# Patient Record
Sex: Male | Born: 1970 | Race: Black or African American | Hispanic: No | Smoking: Never smoker
Health system: Southern US, Community
[De-identification: ages and names within clinical notes are randomized; demographics above are authoritative.]

---

## 1997-10-26 ENCOUNTER — Emergency Department (HOSPITAL_COMMUNITY): Admission: EM | Admit: 1997-10-26 | Discharge: 1997-10-26 | Payer: Self-pay | Admitting: Emergency Medicine

## 2014-08-19 ENCOUNTER — Encounter (HOSPITAL_COMMUNITY): Payer: Self-pay | Admitting: Emergency Medicine

## 2014-08-19 ENCOUNTER — Emergency Department (HOSPITAL_COMMUNITY): Payer: BLUE CROSS/BLUE SHIELD

## 2014-08-19 ENCOUNTER — Emergency Department (HOSPITAL_COMMUNITY)
Admission: EM | Admit: 2014-08-19 | Discharge: 2014-08-19 | Disposition: A | Discharge status: Home / Self Care | Payer: BLUE CROSS/BLUE SHIELD | Attending: Emergency Medicine | Admitting: Emergency Medicine

## 2014-08-19 DIAGNOSIS — J9801 Acute bronchospasm: Secondary | ICD-10-CM | POA: Diagnosis not present

## 2014-08-19 DIAGNOSIS — R0602 Shortness of breath: Secondary | ICD-10-CM | POA: Diagnosis present

## 2014-08-19 LAB — CBC
HCT: 46.9 % (ref 39.0–52.0)
Hemoglobin: 16.3 g/dL (ref 13.0–17.0)
MCH: 30.8 pg (ref 26.0–34.0)
MCHC: 34.8 g/dL (ref 30.0–36.0)
MCV: 88.7 fL (ref 78.0–100.0)
Platelets: 236 10*3/uL (ref 150–400)
RBC: 5.29 MIL/uL (ref 4.22–5.81)
RDW: 13.5 % (ref 11.5–15.5)
WBC: 10.6 10*3/uL — ABNORMAL HIGH (ref 4.0–10.5)

## 2014-08-19 LAB — COMPREHENSIVE METABOLIC PANEL
ALT: 26 U/L (ref 0–53)
AST: 23 U/L (ref 0–37)
Albumin: 4.4 g/dL (ref 3.5–5.2)
Alkaline Phosphatase: 71 U/L (ref 39–117)
Anion gap: 9 (ref 5–15)
BUN: 11 mg/dL (ref 6–23)
CO2: 23 mmol/L (ref 19–32)
Calcium: 9 mg/dL (ref 8.4–10.5)
Chloride: 108 mmol/L (ref 96–112)
Creatinine, Ser: 1.12 mg/dL (ref 0.50–1.35)
GFR calc Af Amer: >90 mL/min (ref >90)
GFR calc non Af Amer: 79 mL/min — ABNORMAL LOW (ref >90)
GLUCOSE: 124 mg/dL — AB (ref 70–99)
Potassium: 3.8 mmol/L (ref 3.5–5.1)
Sodium: 140 mmol/L (ref 135–145)
Total Bilirubin: 0.6 mg/dL (ref 0.3–1.2)
Total Protein: 8.2 g/dL (ref 6.0–8.3)

## 2014-08-19 MED ORDER — HYDROMORPHONE HCL 1 MG/ML IJ SOLN: 0.5000 mg | Freq: Once | INTRAMUSCULAR | Status: DC

## 2014-08-19 MED ORDER — IPRATROPIUM-ALBUTEROL 0.5-2.5 (3) MG/3ML IN SOLN
RESPIRATORY_TRACT | Status: AC
  Administered 2014-08-19: 3 mL via RESPIRATORY_TRACT
  Filled 2014-08-19: qty 3

## 2014-08-19 MED ORDER — IPRATROPIUM-ALBUTEROL 0.5-2.5 (3) MG/3ML IN SOLN
3.0000 mL | Freq: Once | RESPIRATORY_TRACT | Status: AC
  Administered 2014-08-19: 3 mL via RESPIRATORY_TRACT
  Filled 2014-08-19: qty 3

## 2014-08-19 MED ORDER — ALBUTEROL SULFATE HFA 108 (90 BASE) MCG/ACT IN AERS
1.0000 | INHALATION_SPRAY | RESPIRATORY_TRACT | Status: DC | PRN
  Administered 2014-08-19: 2 via RESPIRATORY_TRACT
  Filled 2014-08-19: qty 6.7

## 2014-08-19 MED ORDER — IPRATROPIUM-ALBUTEROL 0.5-2.5 (3) MG/3ML IN SOLN
3.0000 mL | Freq: Once | RESPIRATORY_TRACT | Status: AC
  Administered 2014-08-19: 3 mL via RESPIRATORY_TRACT

## 2014-08-19 MED ORDER — PREDNISONE 20 MG PO TABS: 60.0000 mg | ORAL_TABLET | Freq: Every day | ORAL | Status: DC

## 2014-08-19 MED ORDER — METHYLPREDNISOLONE SODIUM SUCC 125 MG IJ SOLR
125.0000 mg | Freq: Once | INTRAMUSCULAR | Status: AC
  Administered 2014-08-19: 125 mg via INTRAVENOUS
  Filled 2014-08-19: qty 2

## 2014-08-19 MED ORDER — ALBUTEROL SULFATE HFA 108 (90 BASE) MCG/ACT IN AERS: 1.0000 | INHALATION_SPRAY | RESPIRATORY_TRACT | Status: DC | PRN

## 2014-08-19 NOTE — ED Notes (Signed)
Pt ambulated well in the hall.  Pt denies significant change in breathing during ambulation; pt's O2 sat 91-92% on room air while ambulating.

## 2014-08-19 NOTE — Discharge Instructions (Signed)
Please take albuterol and prednisone as prescribed.  Is important for you to follow-up with a primary care doctor for further workup of your wheezing and persistent cough to determine if you have asthma.  Return to the [facility] department for worsening condition or new concerning symptoms.    Bronchospasm A bronchospasm is a spasm or tightening of the airways going into the lungs. During a bronchospasm breathing becomes more difficult because the airways get smaller. When this happens there can be coughing, a whistling sound when breathing (wheezing), and difficulty breathing. Bronchospasm is often associated with asthma, but not all patients who experience a bronchospasm have asthma. CAUSES  A bronchospasm is caused by inflammation or irritation of the airways. The inflammation or irritation may be triggered by:   Allergies (such as to animals, pollen, food, or mold). Allergens that cause bronchospasm may cause wheezing immediately after exposure or many hours later.   Infection. Viral infections are believed to be the most common cause of bronchospasm.   Exercise.   Irritants (such as pollution, cigarette smoke, strong odors, aerosol sprays, and paint fumes).   Weather changes. Winds increase molds and pollens in the air. Rain refreshes the air by washing irritants out. Cold air may cause inflammation.   Stress and emotional upset.  SIGNS AND SYMPTOMS   Wheezing.   Excessive nighttime coughing.   Frequent or severe coughing with a simple cold.   Chest tightness.   Shortness of breath.  DIAGNOSIS  Bronchospasm is usually diagnosed through a history and physical exam. Tests, such as chest X-rays, are sometimes done to look for other conditions. TREATMENT   Inhaled medicines can be given to open up your airways and help you breathe. The medicines can be given using either an inhaler or a nebulizer machine.  Corticosteroid medicines may be given for severe bronchospasm,  usually when it is associated with asthma. HOME CARE INSTRUCTIONS   Always have a plan prepared for seeking medical care. Know when to call your health care provider and local emergency services (911 in the [place] Know where you can access local emergency care.  Only take medicines as directed by your health care provider.  If you were prescribed an inhaler or nebulizer machine, ask your health care provider to explain how to use it correctly. Always use a spacer with your inhaler if you were given one.  It is necessary to remain calm during an attack. Try to relax and breathe more slowly.  Control your home environment in the following ways:   Change your heating and air conditioning filter at least once a month.   Limit your use of fireplaces and wood stoves.  Do not smoke and do not allow smoking in your home.   Avoid exposure to perfumes and fragrances.   Get rid of pests (such as roaches and mice) and their droppings.   Throw away plants if you see mold on them.   Keep your house clean and dust free.   Replace carpet with wood, tile, or vinyl flooring. Carpet can trap dander and dust.   Use allergy-proof pillows, mattress covers, and box spring covers.   Wash bed sheets and blankets every week in hot water and dry them in a dryer.   Use blankets that are made of polyester or cotton.   Wash hands frequently. SEEK MEDICAL CARE IF:   You have muscle aches.   You have chest pain.   The sputum changes from clear or white to yellow, green, gray,  or bloody.   The sputum you cough up gets thicker.   There are problems that may be related to the medicine you are given, such as a rash, itching, swelling, or trouble breathing.  SEEK IMMEDIATE MEDICAL CARE IF:   You have worsening wheezing and coughing even after taking your prescribed medicines.   You have increased difficulty breathing.   You develop severe chest pain. MAKE SURE YOU:   Understand  these instructions.  Will watch your condition.  Will get help right away if you are not doing well or get worse. Document Released: 04/17/2003 Document Revised: 04/19/2013 Document Reviewed: 10/04/2012 [facility] Patient Information 2015 [place], [place]. This information is not intended to replace advice given to you by your health care provider. Make sure you discuss any questions you have with your health care provider.  How to Use an Inhaler Proper inhaler technique is very important. Good technique ensures that the medicine reaches the lungs. Poor technique results in depositing the medicine on the tongue and back of the throat rather than in the airways. If you do not use the inhaler with good technique, the medicine will not help you. STEPS TO FOLLOW IF USING AN INHALER WITHOUT AN EXTENSION TUBE  Remove the cap from the inhaler.  If you are using the inhaler for the first time, you will need to prime it. Shake the inhaler for 5 seconds and release four puffs into the air, away from your face. Ask your health care provider or pharmacist if you have questions about priming your inhaler.  Shake the inhaler for 5 seconds before each breath in (inhalation).  Position the inhaler so that the top of the canister faces up.  Put your index finger on the top of the medicine canister. Your thumb supports the bottom of the inhaler.  Open your mouth.  Either place the inhaler between your teeth and place your lips tightly around the mouthpiece, or hold the inhaler 1-2 inches away from your open mouth. If you are unsure of which technique to use, ask your health care provider.  Breathe out (exhale) normally and as completely as possible.  Press the canister down with your index finger to release the medicine.  At the same time as the canister is pressed, inhale deeply and slowly until your lungs are completely filled. This should take 4-6 seconds. Keep your tongue down.  Hold the medicine in  your lungs for 5-10 seconds (10 seconds is best). This helps the medicine get into the small airways of your lungs.  Breathe out slowly, through pursed lips. Whistling is an example of pursed lips.  Wait at least 15-30 seconds between puffs. Continue with the above steps until you have taken the number of puffs your health care provider has ordered. Do not use the inhaler more than your health care provider tells you.  Replace the cap on the inhaler.  Follow the directions from your health care provider or the inhaler insert for cleaning the inhaler. STEPS TO FOLLOW IF USING AN INHALER WITH AN EXTENSION (SPACER)  Remove the cap from the inhaler.  If you are using the inhaler for the first time, you will need to prime it. Shake the inhaler for 5 seconds and release four puffs into the air, away from your face. Ask your health care provider or pharmacist if you have questions about priming your inhaler.  Shake the inhaler for 5 seconds before each breath in (inhalation).  Place the open end of the spacer  onto the mouthpiece of the inhaler.  Position the inhaler so that the top of the canister faces up and the spacer mouthpiece faces you.  Put your index finger on the top of the medicine canister. Your thumb supports the bottom of the inhaler and the spacer.  Breathe out (exhale) normally and as completely as possible.  Immediately after exhaling, place the spacer between your teeth and into your mouth. Close your lips tightly around the spacer.  Press the canister down with your index finger to release the medicine.  At the same time as the canister is pressed, inhale deeply and slowly until your lungs are completely filled. This should take 4-6 seconds. Keep your tongue down and out of the way.  Hold the medicine in your lungs for 5-10 seconds (10 seconds is best). This helps the medicine get into the small airways of your lungs. Exhale.  Repeat inhaling deeply through the spacer  mouthpiece. Again hold that breath for up to 10 seconds (10 seconds is best). Exhale slowly. If it is difficult to take this second deep breath through the spacer, breathe normally several times through the spacer. Remove the spacer from your mouth.  Wait at least 15-30 seconds between puffs. Continue with the above steps until you have taken the number of puffs your health care provider has ordered. Do not use the inhaler more than your health care provider tells you.  Remove the spacer from the inhaler, and place the cap on the inhaler.  Follow the directions from your health care provider or the inhaler insert for cleaning the inhaler and spacer. If you are using different kinds of inhalers, use your quick relief medicine to open the airways 10-15 minutes before using a steroid if instructed to do so by your health care provider. If you are unsure which inhalers to use and the order of using them, ask your health care provider, nurse, or respiratory therapist. If you are using a steroid inhaler, always rinse your mouth with water after your last puff, then gargle and spit out the water. Do not swallow the water. AVOID:  Inhaling before or after starting the spray of medicine. It takes practice to coordinate your breathing with triggering the spray.  Inhaling through the nose (rather than the mouth) when triggering the spray. HOW TO DETERMINE IF YOUR INHALER IS FULL OR NEARLY EMPTY You cannot know when an inhaler is empty by shaking it. A few inhalers are now being made with dose counters. Ask your health care provider for a prescription that has a dose counter if you feel you need that extra help. If your inhaler does not have a counter, ask your health care provider to help you determine the date you need to refill your inhaler. Write the refill date on a calendar or your inhaler canister. Refill your inhaler 7-10 days before it runs out. Be sure to keep an adequate supply of medicine. This  includes making sure it is not expired, and that you have a spare inhaler.  SEEK MEDICAL CARE IF:   Your symptoms are only partially relieved with your inhaler.  You are having trouble using your inhaler.  You have some increase in phlegm. SEEK IMMEDIATE MEDICAL CARE IF:   You feel little or no relief with your inhalers. You are still wheezing and are feeling shortness of breath or tightness in your chest or both.  You have dizziness, headaches, or a fast heart rate.  You have chills, fever, or night  sweats.  You have a noticeable increase in phlegm production, or there is blood in the phlegm. MAKE SURE YOU:   Understand these instructions.  Will watch your condition.  Will get help right away if you are not doing well or get worse. Document Released: 04/11/2000 Document Revised: 02/02/2013 Document Reviewed: 11/11/2012 [facility] Patient Information 2015 [place], [place]. This information is not intended to replace advice given to you by your health care provider. Make sure you discuss any questions you have with your health care provider.  Asthma Asthma is a condition of the lungs in which the airways tighten and narrow. Asthma can make it hard to breathe. Asthma cannot be cured, but medicine and lifestyle changes can help control it. Asthma may be started (triggered) by:  Animal skin flakes (dander).  Dust.  Cockroaches.  Pollen.  Mold.  Smoke.  Cleaning products.  Hair sprays or aerosol sprays.  Paint fumes or strong smells.  Cold air, weather changes, and winds.  Crying or laughing hard.  Stress.  Certain medicines or drugs.  Foods, such as dried fruit, potato chips, and sparkling grape juice.  Infections or conditions (colds, flu).  Exercise.  Certain medical conditions or diseases.  Exercise or tiring activities. HOME CARE   Take medicine as told by your doctor.  Use a peak flow meter as told by your doctor. A peak flow meter is a tool that  measures how well the lungs are working.  Record and keep track of the peak flow meter's readings.  Understand and use the asthma action plan. An asthma action plan is a written plan for taking care of your asthma and treating your attacks.  To help prevent asthma attacks:  Do not smoke. Stay away from secondhand smoke.  Change your heating and air conditioning filter often.  Limit your use of fireplaces and wood stoves.  Get rid of pests (such as roaches and mice) and their droppings.  Throw away plants if you see mold on them.  Clean your floors. Dust regularly. Use cleaning products that do not smell.  Have someone vacuum when you are not home. Use a vacuum cleaner with a HEPA filter if possible.  Replace carpet with wood, tile, or vinyl flooring. Carpet can trap animal skin flakes and dust.  Use allergy-proof pillows, mattress covers, and box spring covers.  Wash bed sheets and blankets every week in hot water and dry them in a dryer.  Use blankets that are made of polyester or cotton.  Clean bathrooms and kitchens with bleach. If possible, have someone repaint the walls in these rooms with mold-resistant paint. Keep out of the rooms that are being cleaned and painted.  Wash hands often. GET HELP IF:  You have make a whistling sound when breaking (wheeze), have shortness of breath, or have a cough even if taking medicine to prevent attacks.  The colored mucus you cough up (sputum) is thicker than usual.  The colored mucus you cough up changes from clear or white to yellow, green, gray, or bloody.  You have problems from the medicine you are taking such as:  A rash.  Itching.  Swelling.  Trouble breathing.  You need reliever medicines more than 2-3 times a week.  Your peak flow measurement is still at 50-79% of your personal best after following the action plan for 1 hour.  You have a fever. GET HELP RIGHT AWAY IF:   You seem to be worse and are not  responding to medicine during  an asthma attack.  You are short of breath even at rest.  You get short of breath when doing very little activity.  You have trouble eating, drinking, or talking.  You have chest pain.  You have a fast heartbeat.  Your lips or fingernails start to turn blue.  You are light-headed, dizzy, or faint.  Your peak flow is less than 50% of your personal best. MAKE SURE YOU:   Understand these instructions.  Will watch your condition.  Will get help right away if you are not doing well or get worse. Document Released: 10/01/2007 Document Revised: 08/29/2013 Document Reviewed: 11/11/2012 [facility] Patient Information 2015 [place]. This information is not intended to replace advice given to you by your health care provider. Make sure you discuss any questions you have with your health care provider.

## 2014-08-19 NOTE — ED Notes (Signed)
Pt arrived to the ED with a complaint of shortness of breath.  Pt states he has had congestion for today.  Pt states he has had shortness of breath sincde the same time.  Pt has expiratory wheezing.  Pt has labored breathing.

## 2014-08-19 NOTE — ED Provider Notes (Signed)
CSN: [id number]     Arrival date & time 08/19/14  0300 History   First MD Initiated Contact with Patient 08/19/14 0358     Chief Complaint  Patient presents with  . Shortness of Breath     (Consider location/radiation/quality/duration/timing/severity/associated sxs/prior Treatment) HPI 44 yo male presents to the ER from home with complaint of cough, congestion, wheezing, and shortness of breath.  No history of asthma, but reports he usually has similar sxs 1-2 times a year.  Asthma as child.  Initial sats of 90%, diffuse ins/exp wheezing.  Has received 1 duoneb prior to my evaluation and reports he is feeling better No past medical history on file. No past surgical history on file. No family history on file. History  Substance Use Topics  . Smoking status: Never Smoker   . Smokeless tobacco: Never Used  . Alcohol Use: Yes    Review of Systems  Respiratory: Positive for cough, chest tightness, shortness of breath and wheezing.   All other systems reviewed and are negative.     Allergies  Review of patient's allergies indicates no known allergies.  Home Medications   Prior to Admission medications   Medication Sig Start Date End Date Taking? Authorizing Provider  albuterol (PROVENTIL HFA;VENTOLIN HFA) 108 (90 BASE) MCG/ACT inhaler Inhale 1-2 puffs into the lungs every 4 (four) hours as needed for wheezing or shortness of breath. 08/19/14   Marisa Severinlga Sanaiya Welliver, MD  guaiFENesin (MUCINEX) 600 MG 12 hr tablet Take 600 mg by mouth once.   Yes Historical Provider, MD  guaiFENesin (ROBITUSSIN) 100 MG/5ML SOLN Take 5 mLs by mouth once.   Yes Historical Provider, MD  predniSONE (DELTASONE) 20 MG tablet Take 3 tablets (60 mg total) by mouth daily. 08/19/14   Marisa Severinlga Romy Ipock, MD   BP 135/71 mmHg  Pulse 84  Temp(Src) 99.4 F (37.4 C) (Oral)  Resp 20  SpO2 93% Physical Exam  Constitutional: He is oriented to person, place, and time. He appears well-developed and well-nourished.  HENT:  Head:  Normocephalic and atraumatic.  Nose: Nose normal.  Mouth/Throat: Oropharynx is clear and moist.  Eyes: Conjunctivae and EOM are normal. Pupils are equal, round, and reactive to light.  Neck: Normal range of motion. Neck supple. No JVD present. No tracheal deviation present. No thyromegaly present.  Cardiovascular: Normal rate, regular rhythm, normal heart sounds and intact distal pulses.  Exam reveals no gallop and no friction rub.   No murmur heard. Pulmonary/Chest: Effort normal. No stridor. No respiratory distress. He has wheezes. He has no rales. He exhibits no tenderness.  Abdominal: Soft. Bowel sounds are normal. He exhibits no distension and no mass. There is no tenderness. There is no rebound and no guarding.  Musculoskeletal: Normal range of motion. He exhibits no edema or tenderness.  Lymphadenopathy:    He has no cervical adenopathy.  Neurological: He is alert and oriented to person, place, and time. He displays normal reflexes. He exhibits normal muscle tone. Coordination normal.  Skin: Skin is warm and dry. No rash noted. No erythema. No pallor.  Psychiatric: He has a normal mood and affect. His behavior is normal. Judgment and thought content normal.  Nursing note and vitals reviewed.   ED Course  Procedures (including critical care time) Labs Review Labs Reviewed  CBC - Abnormal; Notable for the following:    WBC 10.6 (*)    All other components within normal limits  COMPREHENSIVE METABOLIC PANEL - Abnormal; Notable for the following:    Glucose, Bld  124 (*)    GFR calc non Af Amer 79 (*)    All other components within normal limits    Imaging Review Dg Chest 2 View  08/19/2014   CLINICAL DATA:  Acute onset of shortness of breath and congestion. Expiratory wheezing. Initial encounter.  EXAM: CHEST  2 VIEW  COMPARISON:  None.  FINDINGS: The lungs are well-aerated and clear. There is no evidence of focal opacification, pleural effusion or pneumothorax.  The heart is  normal in size; the mediastinal contour is within normal limits. No acute osseous abnormalities are seen.  IMPRESSION: No acute cardiopulmonary process seen.   Electronically Signed   By: Roanna Raider M.D.   On: 08/19/2014 04:09     EKG Interpretation None      MDM   Final diagnoses:  Bronchospasm    Pt with persistent wheezing after initial neb.  To receive solumedrol, additional neb.  Wheezing much improved.  Pt with low O2 sats with ambulation, but stays above 90%.  No respiratory distress.  Pt to be given rx for prednisone, albuterol inhaler and given f/u resources.    Marisa Severin, MD 08/19/14 1740

## 2015-01-25 ENCOUNTER — Encounter: Payer: Self-pay | Admitting: Internal Medicine

## 2015-01-25 ENCOUNTER — Ambulatory Visit (INDEPENDENT_AMBULATORY_CARE_PROVIDER_SITE_OTHER): Payer: BLUE CROSS/BLUE SHIELD | Admitting: Internal Medicine

## 2015-01-25 ENCOUNTER — Other Ambulatory Visit (INDEPENDENT_AMBULATORY_CARE_PROVIDER_SITE_OTHER): Payer: BLUE CROSS/BLUE SHIELD

## 2015-01-25 VITALS — BP 100/70 | HR 61 | Ht 70.0 in | Wt 241.0 lb

## 2015-01-25 DIAGNOSIS — Z0000 Encounter for general adult medical examination without abnormal findings: Secondary | ICD-10-CM

## 2015-01-25 DIAGNOSIS — Z0001 Encounter for general adult medical examination with abnormal findings: Secondary | ICD-10-CM

## 2015-01-25 DIAGNOSIS — R062 Wheezing: Secondary | ICD-10-CM | POA: Diagnosis not present

## 2015-01-25 DIAGNOSIS — J301 Allergic rhinitis due to pollen: Secondary | ICD-10-CM | POA: Diagnosis not present

## 2015-01-25 DIAGNOSIS — J309 Allergic rhinitis, unspecified: Secondary | ICD-10-CM | POA: Insufficient documentation

## 2015-01-25 LAB — CBC WITH DIFFERENTIAL/PLATELET
BASOS ABS: 0 10*3/uL (ref 0.0–0.1)
Basophils Relative: 0.4 % (ref 0.0–3.0)
Eosinophils Absolute: 0.5 10*3/uL (ref 0.0–0.7)
Eosinophils Relative: 8.6 % — ABNORMAL HIGH (ref 0.0–5.0)
HCT: 46 % (ref 39.0–52.0)
Hemoglobin: 15.6 g/dL (ref 13.0–17.0)
LYMPHS ABS: 1.9 10*3/uL (ref 0.7–4.0)
Lymphocytes Relative: 32.3 % (ref 12.0–46.0)
MCHC: 33.9 g/dL (ref 30.0–36.0)
MCV: 89.9 fl (ref 78.0–100.0)
MONO ABS: 0.4 10*3/uL (ref 0.1–1.0)
MONOS PCT: 7.2 % (ref 3.0–12.0)
NEUTROS PCT: 51.5 % (ref 43.0–77.0)
Neutro Abs: 3 10*3/uL (ref 1.4–7.7)
Platelets: 266 10*3/uL (ref 150.0–400.0)
RBC: 5.11 Mil/uL (ref 4.22–5.81)
RDW: 14.4 % (ref 11.5–15.5)
WBC: 5.8 10*3/uL (ref 4.0–10.5)

## 2015-01-25 LAB — URINALYSIS, ROUTINE W REFLEX MICROSCOPIC
Bilirubin Urine: NEGATIVE
Ketones, ur: NEGATIVE
Leukocytes, UA: NEGATIVE
Nitrite: NEGATIVE
Specific Gravity, Urine: 1.02 (ref 1.000–1.030)
Total Protein, Urine: NEGATIVE
Urine Glucose: NEGATIVE
Urobilinogen, UA: 0.2 (ref 0.0–1.0)
pH: 7 (ref 5.0–8.0)

## 2015-01-25 LAB — TSH: TSH: 0.71 u[IU]/mL (ref 0.35–4.50)

## 2015-01-25 LAB — HEPATIC FUNCTION PANEL
ALK PHOS: 59 U/L (ref 39–117)
ALT: 19 U/L (ref 0–53)
AST: 16 U/L (ref 0–37)
Albumin: 4.1 g/dL (ref 3.5–5.2)
BILIRUBIN TOTAL: 0.6 mg/dL (ref 0.2–1.2)
Bilirubin, Direct: 0.2 mg/dL (ref 0.0–0.3)
Total Protein: 7.9 g/dL (ref 6.0–8.3)

## 2015-01-25 LAB — BASIC METABOLIC PANEL
BUN: 11 mg/dL (ref 6–23)
CO2: 30 mEq/L (ref 19–32)
Calcium: 9.6 mg/dL (ref 8.4–10.5)
Chloride: 104 mEq/L (ref 96–112)
Creatinine, Ser: 1.16 mg/dL (ref 0.40–1.50)
GFR: 87.81 mL/min (ref >60.00)
GLUCOSE: 93 mg/dL (ref 70–99)
POTASSIUM: 4 meq/L (ref 3.5–5.1)
SODIUM: 140 meq/L (ref 135–145)

## 2015-01-25 LAB — H. PYLORI ANTIBODY, IGG: H Pylori IgG: NEGATIVE

## 2015-01-25 LAB — LIPID PANEL
CHOL/HDL RATIO: 3
Cholesterol: 162 mg/dL (ref 0–200)
HDL: 48.2 mg/dL (ref >39.00)
LDL Cholesterol: 96 mg/dL (ref 0–99)
NONHDL: 113.33
Triglycerides: 87 mg/dL (ref 0.0–149.0)
VLDL: 17.4 mg/dL (ref 0.0–40.0)

## 2015-01-25 LAB — VITAMIN D 25 HYDROXY (VIT D DEFICIENCY, FRACTURES): VITD: 17.88 ng/mL — ABNORMAL LOW (ref 30.00–100.00)

## 2015-01-25 MED ORDER — LORATADINE 10 MG PO TABS: 10.0000 mg | ORAL_TABLET | Freq: Every day | ORAL | Status: DC

## 2015-01-25 MED ORDER — DEXLANSOPRAZOLE 30 MG PO CPDR: 30.0000 mg | DELAYED_RELEASE_CAPSULE | Freq: Every day | ORAL | Status: DC

## 2015-01-25 MED ORDER — VITAMIN D3 50 MCG (2000 UT) PO CAPS: 2000.0000 [IU] | ORAL_CAPSULE | Freq: Every day | ORAL | Status: DC

## 2015-01-25 NOTE — Patient Instructions (Signed)
Hiatal Hernia A hiatal hernia occurs when part of your stomach slides above the muscle that separates your abdomen from your chest (diaphragm). You can be born with a hiatal hernia (congenital), or it may develop over time. In almost all cases of hiatal hernia, only the top part of the stomach pushes through.  Many people have a hiatal hernia with no symptoms. The larger the hernia, the more likely that you will have symptoms. In some cases, a hiatal hernia allows stomach acid to flow back into the tube that carries food from your mouth to your stomach (esophagus). This may cause heartburn symptoms. Severe heartburn symptoms may mean you have developed a condition called gastroesophageal reflux disease (GERD).  CAUSES  Hiatal hernias are caused by a weakness in the opening (hiatus) where your esophagus passes through your diaphragm to attach to the upper part of your stomach. You may be born with a weakness in your hiatus, or a weakness can develop. RISK FACTORS Older age is a major risk factor for a hiatal hernia. Anything that increases pressure on your diaphragm can also increase your risk of a hiatal hernia. This includes:  Pregnancy.  Excess weight.  Frequent constipation. SIGNS AND SYMPTOMS  People with a hiatal hernia often have no symptoms. If symptoms develop, they are almost always caused by GERD. They may include:  Heartburn.  Belching.  Indigestion.  Trouble swallowing.  Coughing or wheezing.  Sore throat.  Hoarseness.  Chest pain. DIAGNOSIS  A hiatal hernia is sometimes found during an exam for another problem. Your health care provider may suspect a hiatal hernia if you have symptoms of GERD. Tests may be done to diagnose GERD. These may include:  X-rays of your stomach or chest.  An upper gastrointestinal (GI) series. This is an X-ray exam of your GI tract involving the use of a chalky liquid that you swallow. The liquid shows up clearly on the X-ray.  Endoscopy.  This is a procedure to look into your stomach using a thin, flexible tube that has a tiny camera and light on the end of it. TREATMENT  If you have no symptoms, you may not need treatment. If you have symptoms, treatment may include:  Dietary and lifestyle changes to help reduce GERD symptoms.  Medicines. These may include:  Over-the-counter antacids.  Medicines that make your stomach empty more quickly.  Medicines that block the production of stomach acid (H2 blockers).  Stronger medicines to reduce stomach acid (proton pump inhibitors).  You may need surgery to repair the hernia if other treatments are not helping. HOME CARE INSTRUCTIONS   Take all medicines as directed by your health care provider.  Quit smoking, if you smoke.  Try to achieve and maintain a healthy body weight.  Eat frequent small meals instead of three large meals a day. This keeps your stomach from getting too full.  Eat slowly.  Do not lie down right after eating.  Do noteat 1-2 hours before bed.   Do not drink beverages with caffeine. These include cola, coffee, cocoa, and tea.  Do not drink alcohol.  Avoid foods that can make symptoms of GERD worse. These may include:  Fatty foods.  Citrus fruits.  Other foods and drinks that contain acid.  Avoid putting pressure on your belly. Anything that puts pressure on your belly increases the amount of acid that may be pushed up into your esophagus.   Avoid bending over, especially after eating.  Raise the head of your bed   by putting blocks under the legs. This keeps your head and esophagus higher than your stomach.  Do not wear tight clothing around your chest or stomach.  Try not to strain when having a bowel movement, when urinating, or when lifting heavy objects. SEEK MEDICAL CARE IF:  Your symptoms are not controlled with medicines or lifestyle changes.  You are having trouble swallowing.  You have coughing or wheezing that will not  go away. SEEK IMMEDIATE MEDICAL CARE IF:  Your pain is getting worse.  Your pain spreads to your arms, neck, jaw, teeth, or back.  You have shortness of breath.  You sweat for no reason.  You feel sick to your stomach (nauseous) or vomit.  You vomit blood.  You have bright red blood in your stools.  You have black, tarry stools.  Document Released: 07/05/2003 Document Revised: 08/29/2013 Document Reviewed: 04/01/2013 [employer] Patient Information 2015 [employer]. This information is not intended to replace advice given to you by your health care provider. Make sure you discuss any questions you have with your health care provider.  

## 2015-01-25 NOTE — Progress Notes (Signed)
Subjective:  Patient ID: Luis Johns, male    DOB: [birth year]  Age: 44 y.o. MRN: [id number]  CC: Establish Care   HPI Luis Johns presents for a well exam - new pt.  C/o wheezing every night for a long time (years) - worse after eating a big meal at night.Worse w/laying down. Pt drinks coke at night. There was wt gain. MDI helped but caused CP.  Outpatient Prescriptions Prior to Visit  Medication Sig Dispense Refill  . albuterol (PROVENTIL HFA;VENTOLIN HFA) 108 (90 BASE) MCG/ACT inhaler Inhale 1-2 puffs into the lungs every 4 (four) hours as needed for wheezing or shortness of breath. (Patient not taking: Reported on 01/25/2015) 1 Inhaler 0  . guaiFENesin (MUCINEX) 600 MG 12 hr tablet Take 600 mg by mouth once.    Marland Kitchen guaiFENesin (ROBITUSSIN) 100 MG/5ML SOLN Take 5 mLs by mouth once.    . predniSONE (DELTASONE) 20 MG tablet Take 3 tablets (60 mg total) by mouth daily. (Patient not taking: Reported on 01/25/2015) 15 tablet 0   No facility-administered medications prior to visit.    ROS Review of Systems  Constitutional: Negative for appetite change, fatigue and unexpected weight change.  HENT: Negative for congestion, nosebleeds, sneezing, sore throat and trouble swallowing.   Eyes: Negative for itching and visual disturbance.  Respiratory: Positive for wheezing. Negative for cough, chest tightness and shortness of breath.   Cardiovascular: Negative for chest pain, palpitations and leg swelling.  Gastrointestinal: Negative for nausea, diarrhea, blood in stool and abdominal distention.  Genitourinary: Negative for frequency and hematuria.  Musculoskeletal: Negative for back pain, joint swelling, gait problem and neck pain.  Skin: Negative for rash.  Neurological: Negative for dizziness, tremors, speech difficulty and weakness.  Psychiatric/Behavioral: Negative for sleep disturbance, self-injury, dysphoric mood and agitation. The patient is not nervous/anxious.   sneezing    Objective:  BP 100/70 mmHg  Pulse 61  Ht  (1.778 m)  Wt 241 lb (109.317 kg)  BMI 34.58 kg/m2  SpO2 95%  BP Readings from Last 3 Encounters:  01/25/15 100/70  08/19/14 135/71    Wt Readings from Last 3 Encounters:  01/25/15 241 lb (109.317 kg)    Physical Exam  Constitutional: He is oriented to person, place, and time. He appears well-developed. No distress.  NAD  HENT:  Mouth/Throat: Oropharynx is clear and moist.  Eyes: Conjunctivae are normal. Pupils are equal, round, and reactive to light.  Neck: Normal range of motion. No JVD present. No thyromegaly present.  Cardiovascular: Normal rate, regular rhythm, normal heart sounds and intact distal pulses.  Exam reveals no gallop and no friction rub.   No murmur heard. Pulmonary/Chest: Effort normal and breath sounds normal. No respiratory distress. He has no wheezes. He has no rales. He exhibits no tenderness.  Abdominal: Soft. Bowel sounds are normal. He exhibits no distension and no mass. There is no tenderness. There is no rebound and no guarding.  Musculoskeletal: Normal range of motion. He exhibits no edema or tenderness.  Lymphadenopathy:    He has no cervical adenopathy.  Neurological: He is alert and oriented to person, place, and time. He has normal reflexes. No cranial nerve deficit. He exhibits normal muscle tone. He displays a negative Romberg sign. Coordination and gait normal.  Skin: Skin is warm and dry. No rash noted.  Psychiatric: He has a normal mood and affect. His behavior is normal. Judgment and thought content normal.    Lab Results  Component Value Date  WBC 10.6* 08/19/2014   HGB 16.3 08/19/2014   HCT 46.9 08/19/2014   PLT 236 08/19/2014   GLUCOSE 124* 08/19/2014   ALT 26 08/19/2014   AST 23 08/19/2014   NA 140 08/19/2014   K 3.8 08/19/2014   CL 108 08/19/2014   CREATININE 1.12 08/19/2014   BUN 11 08/19/2014   CO2 23 08/19/2014    Dg Chest 2 View  08/19/2014   CLINICAL DATA:  Acute  onset of shortness of breath and congestion. Expiratory wheezing. Initial encounter.  EXAM: CHEST  2 VIEW  COMPARISON:  None.  FINDINGS: The lungs are well-aerated and clear. There is no evidence of focal opacification, pleural effusion or pneumothorax.  The heart is normal in size; the mediastinal contour is within normal limits. No acute osseous abnormalities are seen.  IMPRESSION: No acute cardiopulmonary process seen.   Electronically Signed   By: Roanna Raider M.D.   On: 08/19/2014 04:09    Assessment & Plan:   Ingram was seen today for establish care.  Diagnoses and all orders for this visit:  Well adult exam -     Basic metabolic panel; Future -     CBC with Differential/Platelet; Future -     Hepatic function panel; Future -     H. pylori antibody, IgG; Future -     Lipid panel; Future -     TSH; Future -     Urinalysis; Future -     Vit D  25 hydroxy (rtn osteoporosis monitoring); Future  Wheezing -     Basic metabolic panel; Future -     CBC with Differential/Platelet; Future -     Hepatic function panel; Future -     H. pylori antibody, IgG; Future -     Lipid panel; Future -     TSH; Future -     Urinalysis; Future -     Vit D  25 hydroxy (rtn osteoporosis monitoring); Future  Allergic rhinitis due to pollen  Other orders -     loratadine (CLARITIN) 10 MG tablet; Take 1 tablet (10 mg total) by mouth daily. -     Dexlansoprazole (DEXILANT) 30 MG capsule; Take 1 capsule (30 mg total) by mouth daily. -     Cholecalciferol (VITAMIN D3) 2000 UNITS capsule; Take 1 capsule (2,000 Units total) by mouth daily.   I have discontinued [name]'s guaiFENesin, guaiFENesin, albuterol, and predniSONE. I am also having him start on loratadine, Dexlansoprazole, and Vitamin D3.  Meds ordered this encounter  Medications  . loratadine (CLARITIN) 10 MG tablet    Sig: Take 1 tablet (10 mg total) by mouth daily.    Dispense:  100 tablet    Refill:  3  . Dexlansoprazole (DEXILANT)  30 MG capsule    Sig: Take 1 capsule (30 mg total) by mouth daily.    Dispense:  90 capsule    Refill:  3  . Cholecalciferol (VITAMIN D3) 2000 UNITS capsule    Sig: Take 1 capsule (2,000 Units total) by mouth daily.    Dispense:  100 capsule    Refill:  3     Follow-up: Return in about 3 months (around 04/26/2015) for a follow-up visit.  Sonda Primes, MD

## 2015-01-25 NOTE — Progress Notes (Signed)
Pre visit review using our clinic review tool, if applicable. No additional management support is needed unless otherwise documented below in the visit note. 

## 2015-01-25 NOTE — Assessment & Plan Note (Signed)
Chronic - likely due to [facility] CXR ok Dexilant Rx 1 a day Loose wt, d/c sodas, eat less and not too late

## 2015-01-25 NOTE — Assessment & Plan Note (Signed)
We discussed age appropriate health related issues, including available/recomended screening tests and vaccinations. We discussed a need for adhering to healthy diet and exercise. Labs/EKG were reviewed/ordered. All questions were answered.   

## 2015-01-25 NOTE — Assessment & Plan Note (Signed)
Claritin 10 mg/d 

## 2015-01-26 ENCOUNTER — Other Ambulatory Visit: Payer: Self-pay | Admitting: Internal Medicine

## 2015-01-26 MED ORDER — ERGOCALCIFEROL 1.25 MG (50000 UT) PO CAPS: 50000.0000 [IU] | ORAL_CAPSULE | ORAL | Status: DC

## 2015-01-30 ENCOUNTER — Telehealth: Payer: Self-pay | Admitting: Internal Medicine

## 2015-01-30 NOTE — Telephone Encounter (Signed)
Patients mother called back in regards phone call.  Gave her Dr. Loren Racer instructions from labs.

## 2015-04-26 ENCOUNTER — Ambulatory Visit: Payer: BLUE CROSS/BLUE SHIELD | Admitting: Internal Medicine

## 2015-05-03 ENCOUNTER — Ambulatory Visit (INDEPENDENT_AMBULATORY_CARE_PROVIDER_SITE_OTHER): Payer: BLUE CROSS/BLUE SHIELD | Admitting: Internal Medicine

## 2015-05-03 ENCOUNTER — Encounter: Payer: Self-pay | Admitting: Internal Medicine

## 2015-05-03 VITALS — BP 118/62 | HR 68 | Wt 245.0 lb

## 2015-05-03 DIAGNOSIS — K219 Gastro-esophageal reflux disease without esophagitis: Secondary | ICD-10-CM | POA: Diagnosis not present

## 2015-05-03 DIAGNOSIS — R062 Wheezing: Secondary | ICD-10-CM

## 2015-05-03 DIAGNOSIS — J301 Allergic rhinitis due to pollen: Secondary | ICD-10-CM | POA: Diagnosis not present

## 2015-05-03 MED ORDER — DEXLANSOPRAZOLE 60 MG PO CPDR: 60.0000 mg | DELAYED_RELEASE_CAPSULE | Freq: Every day | ORAL | Status: DC

## 2015-05-03 NOTE — Progress Notes (Signed)
Pre visit review using our clinic review tool, if applicable. No additional management support is needed unless otherwise documented below in the visit note. 

## 2015-05-03 NOTE — Progress Notes (Signed)
Subjective:  Patient ID: Luis Johns, male    DOB: Dec 08, 1970  Age: 45 y.o. MRN: [id number]  CC: No chief complaint on file.   HPI Luis Johns presents for SOB f/u - 75% better; feeling better. F/u GERD and Vit D def  Outpatient Prescriptions Prior to Visit  Medication Sig Dispense Refill  . Cholecalciferol (VITAMIN D3) 2000 UNITS capsule Take 1 capsule (2,000 Units total) by mouth daily. 100 capsule 3  . loratadine (CLARITIN) 10 MG tablet Take 1 tablet (10 mg total) by mouth daily. 100 tablet 3  . Dexlansoprazole (DEXILANT) 30 MG capsule Take 1 capsule (30 mg total) by mouth daily. 90 capsule 3  . ergocalciferol (VITAMIN D2) 50000 UNITS capsule Take 1 capsule (50,000 Units total) by mouth once a week. (Patient not taking: Reported on 05/03/2015) 6 capsule 0   No facility-administered medications prior to visit.    ROS Review of Systems  Constitutional: Negative for appetite change, fatigue and unexpected weight change.  HENT: Negative for congestion, nosebleeds, sneezing, sore throat and trouble swallowing.   Eyes: Negative for itching and visual disturbance.  Respiratory: Positive for shortness of breath and wheezing. Negative for cough.   Cardiovascular: Negative for chest pain, palpitations and leg swelling.  Gastrointestinal: Negative for nausea, diarrhea, blood in stool and abdominal distention.  Genitourinary: Negative for frequency and hematuria.  Musculoskeletal: Negative for back pain, joint swelling, gait problem and neck pain.  Skin: Negative for rash.  Neurological: Negative for dizziness, tremors, speech difficulty and weakness.  Psychiatric/Behavioral: Negative for sleep disturbance, dysphoric mood and agitation. The patient is not nervous/anxious.     Objective:  BP 118/62 mmHg  Pulse 68  Wt 245 lb (111.131 kg)  SpO2 97%  BP Readings from Last 3 Encounters:  05/03/15 118/62  01/25/15 100/70  08/19/14 135/71    Wt Readings from Last 3 Encounters:   05/03/15 245 lb (111.131 kg)  01/25/15 241 lb (109.317 kg)    Physical Exam  Constitutional: He is oriented to person, place, and time. He appears well-developed. No distress.  NAD  HENT:  Mouth/Throat: Oropharynx is clear and moist.  Eyes: Conjunctivae are normal. Pupils are equal, round, and reactive to light.  Neck: Normal range of motion. No JVD present. No thyromegaly present.  Cardiovascular: Normal rate, regular rhythm, normal heart sounds and intact distal pulses.  Exam reveals no gallop and no friction rub.   No murmur heard. Pulmonary/Chest: Effort normal and breath sounds normal. No respiratory distress. He has no wheezes. He has no rales. He exhibits no tenderness.  Abdominal: Soft. Bowel sounds are normal. He exhibits no distension and no mass. There is no tenderness. There is no rebound and no guarding.  Musculoskeletal: Normal range of motion. He exhibits no edema or tenderness.  Lymphadenopathy:    He has no cervical adenopathy.  Neurological: He is alert and oriented to person, place, and time. He has normal reflexes. No cranial nerve deficit. He exhibits normal muscle tone. He displays a negative Romberg sign. Coordination and gait normal.  Skin: Skin is warm and dry. No rash noted.  Psychiatric: He has a normal mood and affect. His behavior is normal. Judgment and thought content normal.    Lab Results  Component Value Date   WBC 5.8 01/25/2015   HGB 15.6 01/25/2015   HCT 46.0 01/25/2015   PLT 266.0 01/25/2015   GLUCOSE 93 01/25/2015   CHOL 162 01/25/2015   TRIG 87.0 01/25/2015   HDL 48.20 01/25/2015  LDLCALC 96 01/25/2015   ALT 19 01/25/2015   AST 16 01/25/2015   NA 140 01/25/2015   K 4.0 01/25/2015   CL 104 01/25/2015   CREATININE 1.16 01/25/2015   BUN 11 01/25/2015   CO2 30 01/25/2015   TSH 0.71 01/25/2015    Dg Chest 2 View  08/19/2014  CLINICAL DATA:  Acute onset of shortness of breath and congestion. Expiratory wheezing. Initial encounter.  EXAM: CHEST  2 VIEW COMPARISON:  None. FINDINGS: The lungs are well-aerated and clear. There is no evidence of focal opacification, pleural effusion or pneumothorax. The heart is normal in size; the mediastinal contour is within normal limits. No acute osseous abnormalities are seen. IMPRESSION: No acute cardiopulmonary process seen. Electronically Signed   By: Roanna Raider M.D.   On: 08/19/2014 04:09    Assessment & Plan:   Diagnoses and all orders for this visit:  Wheezing  Gastroesophageal reflux disease, esophagitis presence not specified  Other orders -     dexlansoprazole (DEXILANT) 60 MG capsule; Take 1 capsule (60 mg total) by mouth daily.   I have discontinued Mr. Marin's Dexlansoprazole and ergocalciferol. I am also having him start on dexlansoprazole. Additionally, I am having him maintain his loratadine and Vitamin D3.  Meds ordered this encounter  Medications  . dexlansoprazole (DEXILANT) 60 MG capsule    Sig: Take 1 capsule (60 mg total) by mouth daily.    Dispense:  90 capsule    Refill:  3     Follow-up: Return in about 6 months (around 10/31/2015) for a follow-up visit.  Sonda Primes, MD

## 2015-05-03 NOTE — Assessment & Plan Note (Signed)
Loratidine  Better w/GERD rx

## 2015-05-03 NOTE — Assessment & Plan Note (Signed)
[facility] - increase to 60 mg/d

## 2015-05-03 NOTE — Assessment & Plan Note (Signed)
[facility] to 60 mg/d 1.17

## 2015-05-09 ENCOUNTER — Telehealth: Payer: Self-pay

## 2015-05-09 NOTE — Telephone Encounter (Signed)
PA initiated via covermymeds. Key for PA is [id number]

## 2015-05-14 NOTE — Telephone Encounter (Signed)
Clinical indications faxed to plan.

## 2015-05-24 NOTE — Telephone Encounter (Signed)
Rec fax stating Dexilant PA is not approved.

## 2015-05-24 NOTE — Telephone Encounter (Signed)
What are covered alternatives? Thx

## 2015-05-28 NOTE — Telephone Encounter (Signed)
Called pt number. Mother stated that he was not available at this time.

## 2015-10-25 ENCOUNTER — Encounter: Payer: Self-pay | Admitting: Internal Medicine

## 2015-10-25 ENCOUNTER — Ambulatory Visit (INDEPENDENT_AMBULATORY_CARE_PROVIDER_SITE_OTHER): Payer: BLUE CROSS/BLUE SHIELD | Admitting: Internal Medicine

## 2015-10-25 VITALS — BP 108/70 | HR 59 | Wt 245.0 lb

## 2015-10-25 DIAGNOSIS — R062 Wheezing: Secondary | ICD-10-CM | POA: Diagnosis not present

## 2015-10-25 DIAGNOSIS — K219 Gastro-esophageal reflux disease without esophagitis: Secondary | ICD-10-CM

## 2015-10-25 NOTE — Assessment & Plan Note (Signed)
Dexilant GERD pillow

## 2015-10-25 NOTE — Progress Notes (Signed)
Subjective:  Patient ID: Luis Johns, male    DOB: [birth year]  Age: 45 y.o. MRN: [id number]  CC: No chief complaint on file.   HPI Luis Johns presents for wheezing, GERD, wt gain f/u   Outpatient Prescriptions Prior to Visit  Medication Sig Dispense Refill  . Cholecalciferol (VITAMIN D3) 2000 UNITS capsule Take 1 capsule (2,000 Units total) by mouth daily. 100 capsule 3  . dexlansoprazole (DEXILANT) 60 MG capsule Take 1 capsule (60 mg total) by mouth daily. 90 capsule 3  . loratadine (CLARITIN) 10 MG tablet Take 1 tablet (10 mg total) by mouth daily. 100 tablet 3   No facility-administered medications prior to visit.    ROS Review of Systems  Constitutional: Negative for appetite change, fatigue and unexpected weight change.  HENT: Negative for congestion, nosebleeds, sneezing, sore throat and trouble swallowing.   Eyes: Negative for itching and visual disturbance.  Respiratory: Negative for cough.   Cardiovascular: Negative for chest pain, palpitations and leg swelling.  Gastrointestinal: Negative for nausea, diarrhea, blood in stool and abdominal distention.  Genitourinary: Negative for frequency and hematuria.  Musculoskeletal: Negative for back pain, joint swelling, gait problem and neck pain.  Skin: Negative for rash.  Neurological: Negative for dizziness, tremors, speech difficulty and weakness.  Psychiatric/Behavioral: Negative for sleep disturbance, dysphoric mood and agitation. The patient is not nervous/anxious.     Objective:  BP 108/70 mmHg  Pulse 59  Wt 245 lb (111.131 kg)  SpO2 96%  BP Readings from Last 3 Encounters:  10/25/15 108/70  05/03/15 118/62  01/25/15 100/70    Wt Readings from Last 3 Encounters:  10/25/15 245 lb (111.131 kg)  05/03/15 245 lb (111.131 kg)  01/25/15 241 lb (109.317 kg)    Physical Exam  Constitutional: He is oriented to person, place, and time. He appears well-developed. No distress.  NAD  HENT:    Mouth/Throat: Oropharynx is clear and moist.  Eyes: Conjunctivae are normal. Pupils are equal, round, and reactive to light.  Neck: Normal range of motion. No JVD present. No thyromegaly present.  Cardiovascular: Normal rate, regular rhythm, normal heart sounds and intact distal pulses.  Exam reveals no gallop and no friction rub.   No murmur heard. Pulmonary/Chest: Effort normal and breath sounds normal. No respiratory distress. He has no wheezes. He has no rales. He exhibits no tenderness.  Abdominal: Soft. Bowel sounds are normal. He exhibits no distension and no mass. There is no tenderness. There is no rebound and no guarding.  Musculoskeletal: Normal range of motion. He exhibits no edema or tenderness.  Lymphadenopathy:    He has no cervical adenopathy.  Neurological: He is alert and oriented to person, place, and time. He has normal reflexes. No cranial nerve deficit. He exhibits normal muscle tone. He displays a negative Romberg sign. Coordination and gait normal.  Skin: Skin is warm and dry. No rash noted.  Psychiatric: He has a normal mood and affect. His behavior is normal. Judgment and thought content normal.    Lab Results  Component Value Date   WBC 5.8 01/25/2015   HGB 15.6 01/25/2015   HCT 46.0 01/25/2015   PLT 266.0 01/25/2015   GLUCOSE 93 01/25/2015   CHOL 162 01/25/2015   TRIG 87.0 01/25/2015   HDL 48.20 01/25/2015   LDLCALC 96 01/25/2015   ALT 19 01/25/2015   AST 16 01/25/2015   NA 140 01/25/2015   K 4.0 01/25/2015   CL 104 01/25/2015   CREATININE 1.16 01/25/2015  BUN 11 01/25/2015   CO2 30 01/25/2015   TSH 0.71 01/25/2015    Dg Chest 2 View  08/19/2014  CLINICAL DATA:  Acute onset of shortness of breath and congestion. Expiratory wheezing. Initial encounter. EXAM: CHEST  2 VIEW COMPARISON:  None. FINDINGS: The lungs are well-aerated and clear. There is no evidence of focal opacification, pleural effusion or pneumothorax. The heart is normal in size; the  mediastinal contour is within normal limits. No acute osseous abnormalities are seen. IMPRESSION: No acute cardiopulmonary process seen. Electronically Signed   By: Roanna RaiderJeffery  Chang M.D.   On: 08/19/2014 04:09    Assessment & Plan:   There are no diagnoses linked to this encounter. I am having Mr. Wedge maintain his loratadine, Vitamin D3, and dexlansoprazole.  No orders of the defined types were placed in this encounter.     Follow-up: No Follow-up on file.  Sonda PrimesAlex Plotnikov, MD

## 2015-10-25 NOTE — Patient Instructions (Signed)
GERD wedge 

## 2015-10-25 NOTE — Assessment & Plan Note (Signed)
GERD pillow Don't eat late

## 2015-10-25 NOTE — Progress Notes (Signed)
Pre visit review using our clinic review tool, if applicable. No additional management support is needed unless otherwise documented below in the visit note. 

## 2015-11-01 ENCOUNTER — Ambulatory Visit: Payer: BLUE CROSS/BLUE SHIELD | Admitting: Internal Medicine

## 2015-12-29 IMAGING — CR DG CHEST 2V
2 series · 2 of 2 positions shown · non-contrast
Comparison: None.

CLINICAL DATA: Acute onset of shortness of breath and congestion.
Expiratory wheezing. Initial encounter.

EXAM:
CHEST  2 VIEW

[w chest pa]
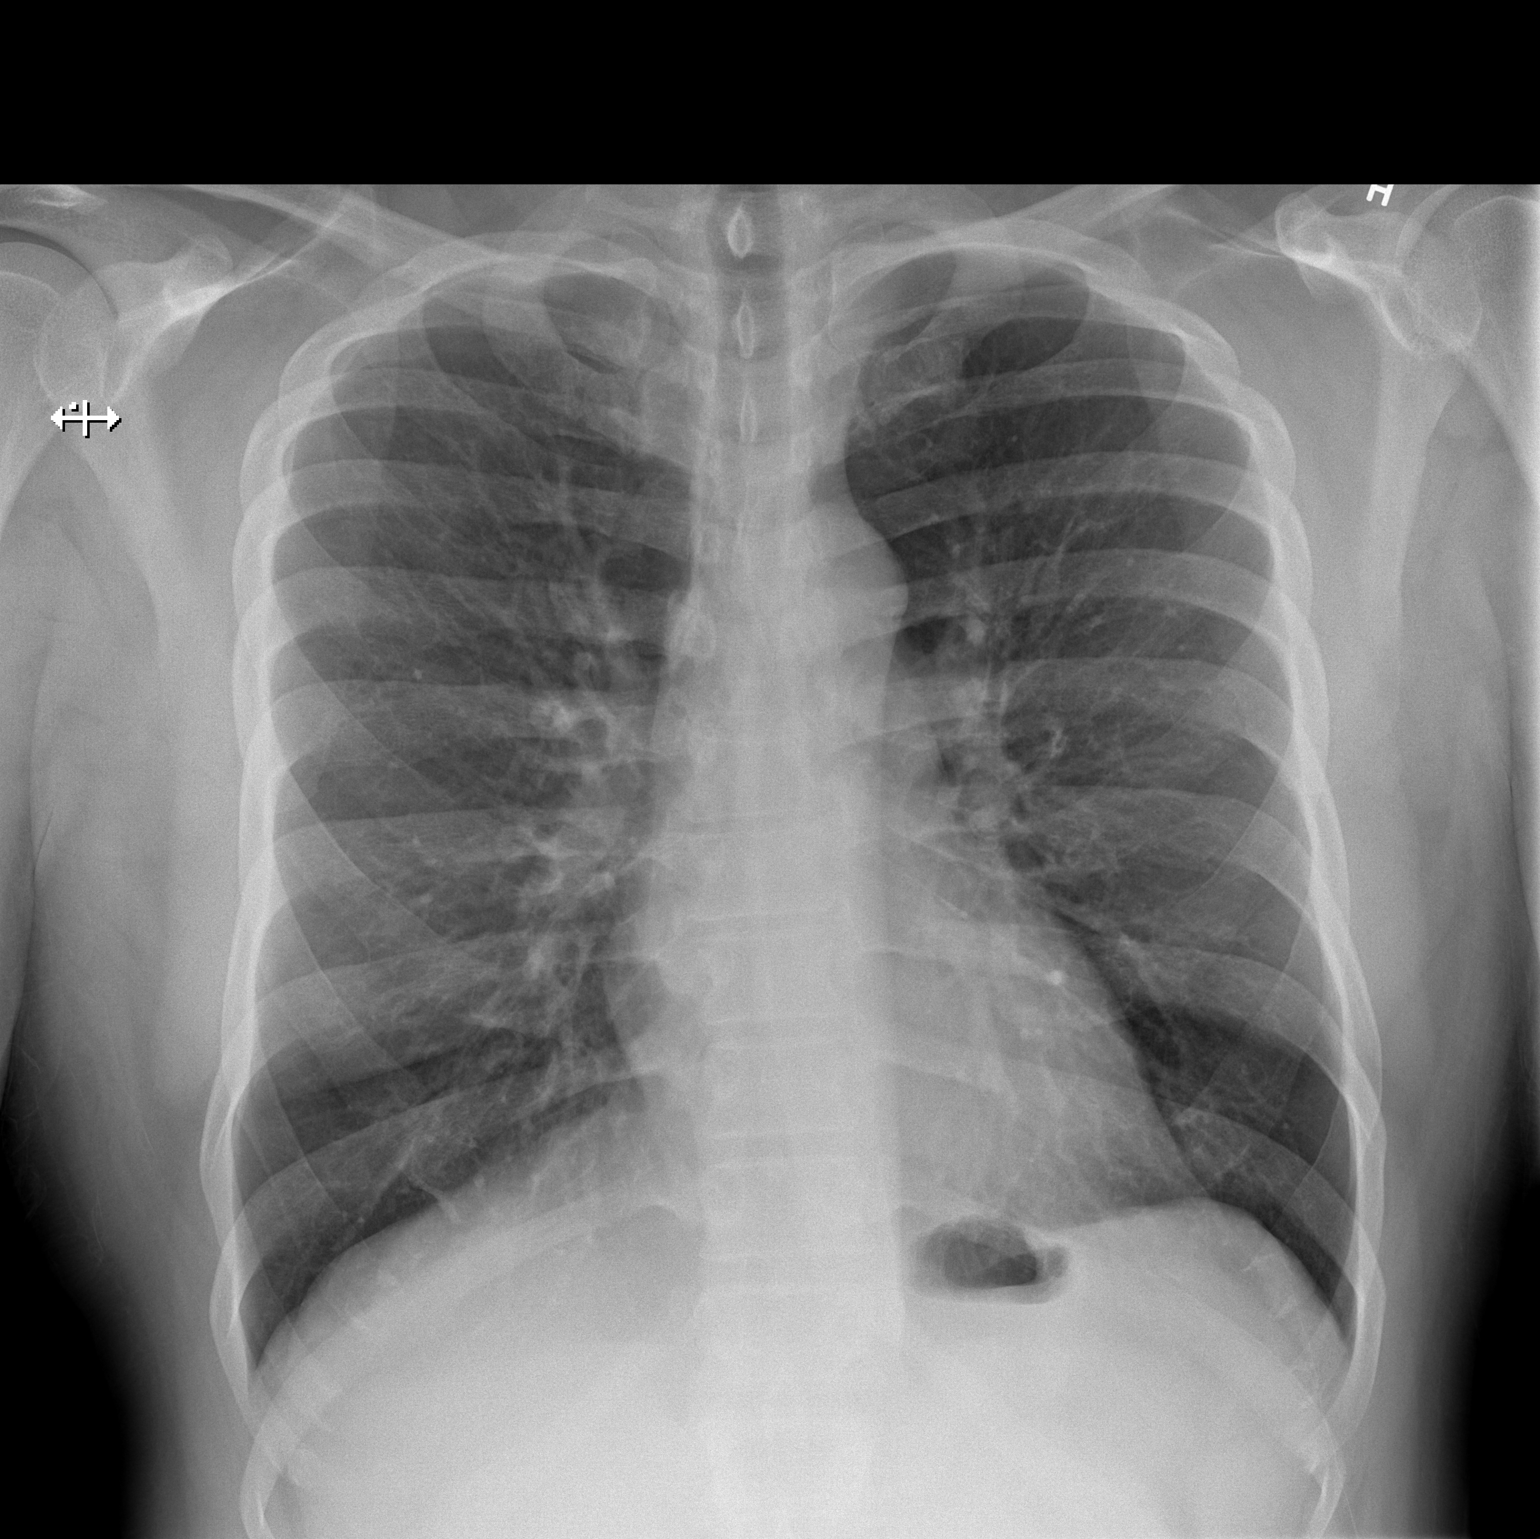

[w chest lat]
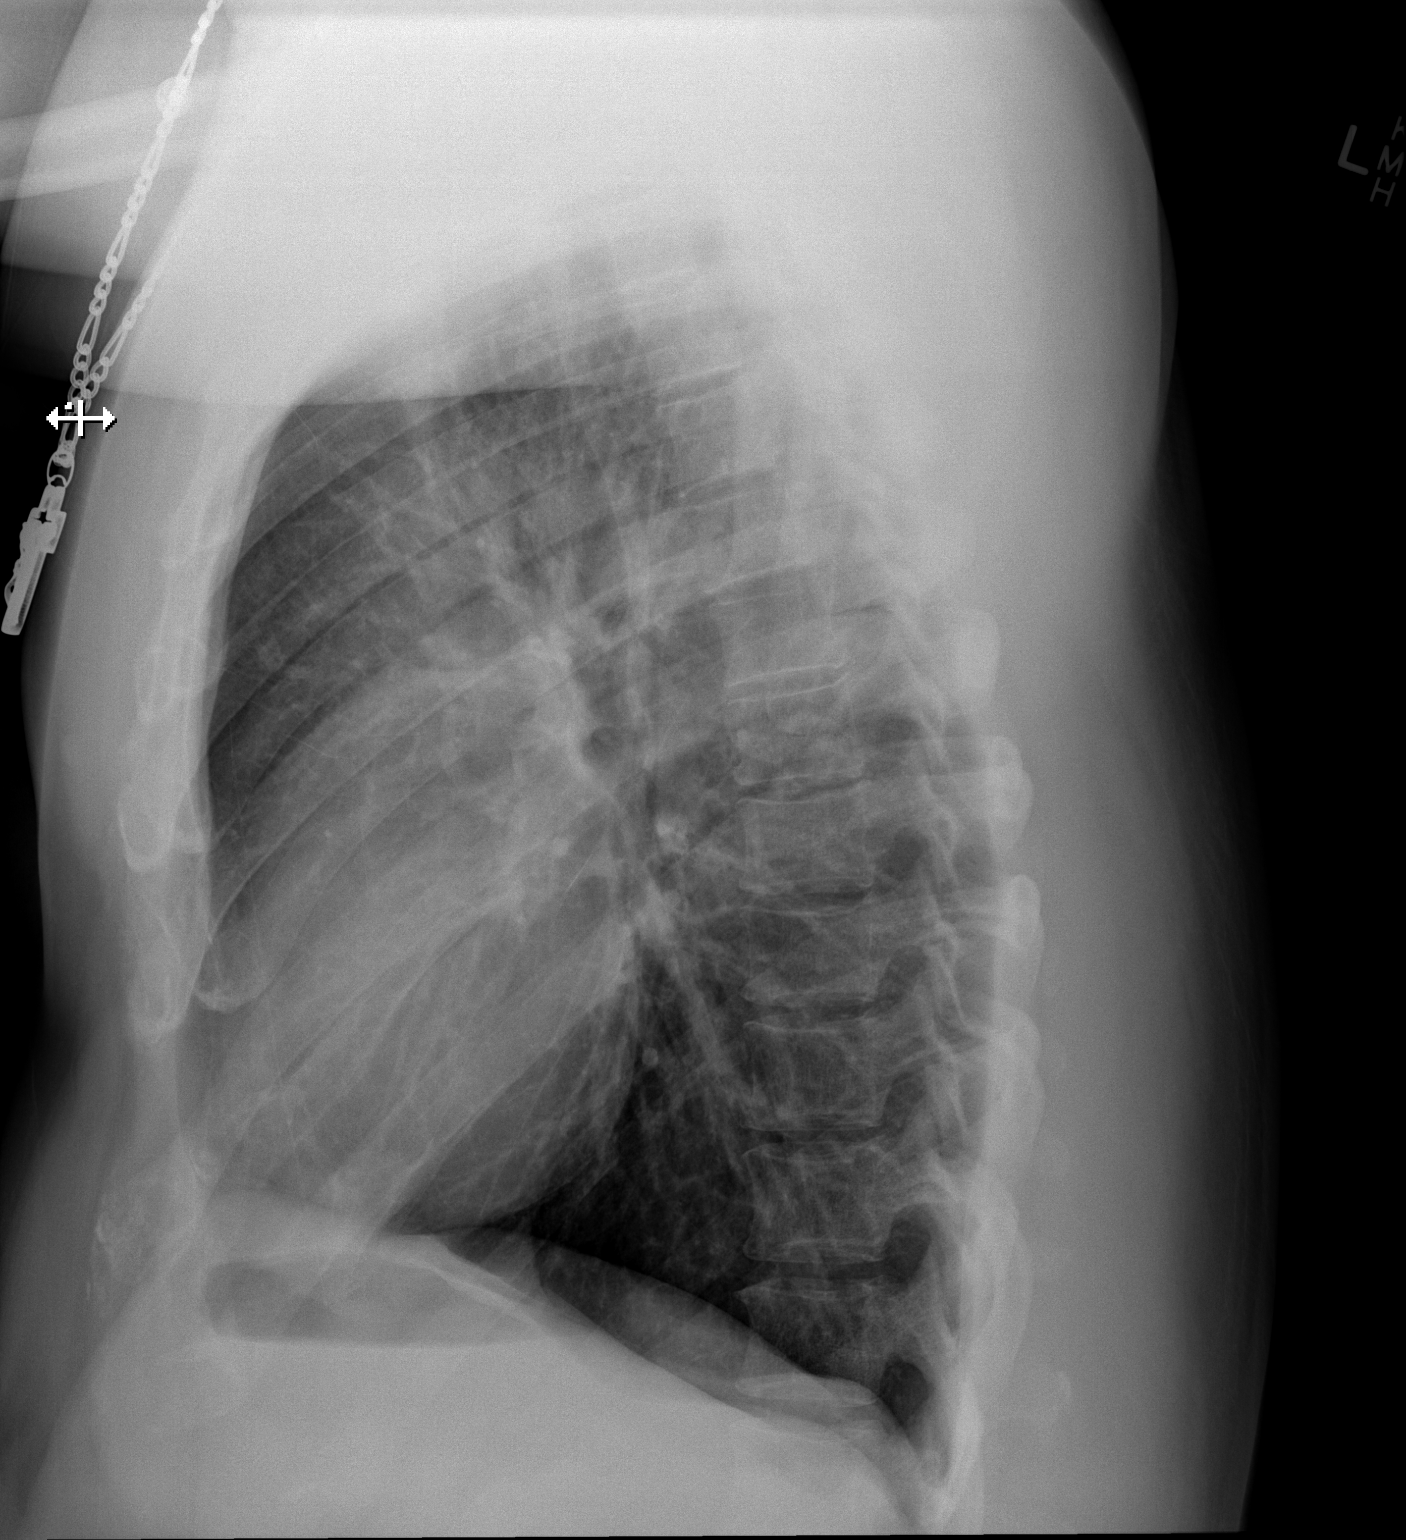

[2 of 2 positions shown; findings below may reference images not displayed]

FINDINGS: The lungs are well-aerated and clear. There is no evidence of focal
opacification, pleural effusion or pneumothorax.

The heart is normal in size; the mediastinal contour is within
normal limits. No acute osseous abnormalities are seen.
IMPRESSION: No acute cardiopulmonary process seen.

## 2016-05-01 ENCOUNTER — Encounter: Payer: BLUE CROSS/BLUE SHIELD | Admitting: Internal Medicine

## 2019-03-02 ENCOUNTER — Ambulatory Visit (HOSPITAL_COMMUNITY)
Admission: EM | Admit: 2019-03-02 | Discharge: 2019-03-02 | Disposition: A | Discharge status: Home / Self Care | Payer: Self-pay | Attending: Family Medicine | Admitting: Family Medicine

## 2019-03-02 ENCOUNTER — Encounter (HOSPITAL_COMMUNITY): Payer: Self-pay | Admitting: Emergency Medicine

## 2019-03-02 ENCOUNTER — Other Ambulatory Visit: Payer: Self-pay

## 2019-03-02 DIAGNOSIS — Z202 Contact with and (suspected) exposure to infections with a predominantly sexual mode of transmission: Secondary | ICD-10-CM | POA: Insufficient documentation

## 2019-03-02 DIAGNOSIS — N4889 Other specified disorders of penis: Secondary | ICD-10-CM | POA: Insufficient documentation

## 2019-03-02 MED ORDER — AZITHROMYCIN 250 MG PO TABS
ORAL_TABLET | ORAL | Status: AC
  Filled 2019-03-02: qty 4

## 2019-03-02 MED ORDER — CEFTRIAXONE SODIUM 250 MG IJ SOLR
250.0000 mg | Freq: Once | INTRAMUSCULAR | Status: AC
  Administered 2019-03-02: 250 mg via INTRAMUSCULAR

## 2019-03-02 MED ORDER — LIDOCAINE HCL (PF) 1 % IJ SOLN
INTRAMUSCULAR | Status: AC
  Filled 2019-03-02: qty 2

## 2019-03-02 MED ORDER — CEFTRIAXONE SODIUM 250 MG IJ SOLR
INTRAMUSCULAR | Status: AC
  Filled 2019-03-02: qty 250

## 2019-03-02 MED ORDER — METRONIDAZOLE 500 MG PO TABS: 500.0000 mg | ORAL_TABLET | Freq: Two times a day (BID) | ORAL | 0 refills | Status: DC

## 2019-03-02 MED ORDER — AZITHROMYCIN 250 MG PO TABS
1000.0000 mg | ORAL_TABLET | Freq: Once | ORAL | Status: AC
  Administered 2019-03-02: 12:00:00 1000 mg via ORAL

## 2019-03-02 MED FILL — METRONIDAZOLE 500 MG TABS: 500 | 7 days supply | Qty: 14 | Fill #0

## 2019-03-02 NOTE — ED Triage Notes (Signed)
Pt reports that his g/f tested positive for trichomonos and a yeast infection a few weeks ago. He did not come and get tested at that time. She did receive treatment but she is having symptoms again. He is here to be screened today.  He does report some penile discomfort since she reported her diagnosis a few weeks ago.

## 2019-03-02 NOTE — ED Notes (Signed)
Cyto swab collected and completed. I sent to main lab for processing.

## 2019-03-02 NOTE — Discharge Instructions (Signed)

## 2019-03-02 NOTE — ED Provider Notes (Signed)
[facility]   [id number] 03/02/19 Arrival Time: 7829  ASSESSMENT & PLAN:  1. Possible exposure to STD   2. Penile irritation     Declines HIV/RPR testing.   Discharge Instructions     You have been given the following medications today for treatment of suspected gonorrhea and/or chlamydia:  cefTRIAXone (ROCEPHIN) injection 250 mg azithromycin (ZITHROMAX) tablet 1,000 mg  Even though we have treated you today, we have sent testing for sexually transmitted infections. We will notify you of any positive results once they are received. If required, we will prescribe any medications you might need.  Please refrain from all sexual activity for at least the next seven days.    Meds ordered this encounter  Medications  . metroNIDAZOLE (FLAGYL) 500 MG tablet    Sig: Take 1 tablet (500 mg total) by mouth 2 (two) times daily.    Dispense:  14 tablet    Refill:  0    Pending: Labs Reviewed  CYTOLOGY, (ORAL, ANAL, URETHRAL) ANCILLARY ONLY    Will notify of any positive results. Instructed to refrain from sexual activity for at least seven days.  Reviewed expectations re: course of current medical issues. Questions answered. Outlined signs and symptoms indicating need for more acute intervention. Patient verbalized understanding. After Visit Summary given.   SUBJECTIVE:  Luis Johns is a 48 y.o. male who presents with complaint of penile irritation. Girlfriend treated for trichomonas a few weeks ago; he never came for tx. "Now we both feel it again". No penile discharge reported. Onset of penile irritation gradual. First noticed a few days ago. Denies: urinary frequency, chills and sweats. Afebrile. No abdominal or pelvic pain. No n/v. No rashes or lesions. Reports that he is sexually active with single male partner. OTC treatment: none.  ROS: As per HPI. All other systems negative.   OBJECTIVE:  Vitals:   03/02/19 1100  BP: 132/84  Pulse: 70  Resp: 16   Temp: 98.4 F (36.9 C)  TempSrc: Temporal  SpO2: 100%     General appearance: alert, cooperative, appears stated age and no distress Throat: lips, mucosa, and tongue normal; teeth and gums normal CV: RRR Lungs: CTAB Back: no CVA tenderness; FROM at waist Abdomen: soft, non-tender GU: normal appearing genitalia Skin: warm and dry Psychological: alert and cooperative; normal mood and affect.    Labs Reviewed  CYTOLOGY, (ORAL, ANAL, URETHRAL) ANCILLARY ONLY    No Known Allergies  PMH: "Healthy".  Family History  Problem Relation Age of Onset  . Stroke Brother   . Heart disease Brother   . Healthy Mother    Social History   Socioeconomic History  . Marital status: Divorced    Spouse name: Not on file  . Number of children: Not on file  . Years of education: Not on file  . Highest education level: Not on file  Occupational History  . Not on file  Social Needs  . Financial resource strain: Not on file  . Food insecurity    Worry: Not on file    Inability: Not on file  . Transportation needs    Medical: Not on file    Non-medical: Not on file  Tobacco Use  . Smoking status: Never Smoker  . Smokeless tobacco: Never Used  Substance and Sexual Activity  . Alcohol use: Yes    Comment: very little  . Drug use: No  . Sexual activity: Yes  Lifestyle  . Physical activity    Days per  week: Not on file    Minutes per session: Not on file  . Stress: Not on file  Relationships  . Social Musician on phone: Not on file    Gets together: Not on file    Attends religious service: Not on file    Active member of club or organization: Not on file    Attends meetings of clubs or organizations: Not on file    Relationship status: Not on file  . Intimate partner violence    Fear of current or ex partner: Not on file    Emotionally abused: Not on file    Physically abused: Not on file    Forced sexual activity: Not on file  Other Topics Concern  . Not on  file  Social History Narrative  . Not on file          Mardella Layman, MD 03/02/19 1136

## 2019-03-03 LAB — CYTOLOGY, (ORAL, ANAL, URETHRAL) ANCILLARY ONLY
Chlamydia: NEGATIVE
Neisseria Gonorrhea: NEGATIVE
Trichomonas: POSITIVE — AB

## 2019-03-04 ENCOUNTER — Telehealth (HOSPITAL_COMMUNITY): Payer: Self-pay | Admitting: Emergency Medicine

## 2019-03-04 NOTE — Telephone Encounter (Signed)
Trichomonas is positive. Rx metronidazole was given at the urgent care visit. Pt needs education to please refrain from sexual intercourse for 7 days to give the medicine time to work. Sexual partners need to be notified and tested/treated. Condoms may reduce risk of reinfection. Recheck for further evaluation if symptoms are not improving.   Attempted to reach patient. No answer at this time. No voicemail set up.

## 2019-03-07 ENCOUNTER — Telehealth: Payer: Self-pay | Admitting: Emergency Medicine

## 2019-03-07 NOTE — Telephone Encounter (Signed)
Contacted patient and made him aware of Trich results. All questions answered. Pt currently taking treatment.

## 2020-07-15 ENCOUNTER — Ambulatory Visit (HOSPITAL_COMMUNITY)
Admission: EM | Admit: 2020-07-15 | Discharge: 2020-07-15 | Disposition: A | Discharge status: Home / Self Care | Payer: 59 | Attending: Urgent Care | Admitting: Urgent Care

## 2020-07-15 ENCOUNTER — Other Ambulatory Visit: Payer: Self-pay

## 2020-07-15 DIAGNOSIS — R0789 Other chest pain: Secondary | ICD-10-CM | POA: Diagnosis not present

## 2020-07-15 DIAGNOSIS — Z7251 High risk heterosexual behavior: Secondary | ICD-10-CM

## 2020-07-15 DIAGNOSIS — Z113 Encounter for screening for infections with a predominantly sexual mode of transmission: Secondary | ICD-10-CM | POA: Diagnosis present

## 2020-07-15 MED ORDER — ALBUTEROL SULFATE HFA 108 (90 BASE) MCG/ACT IN AERS: 1.0000 | INHALATION_SPRAY | Freq: Four times a day (QID) | RESPIRATORY_TRACT | 0 refills | Status: AC | PRN

## 2020-07-15 NOTE — ED Triage Notes (Signed)
Pt reports he had [employer] 2 weeks ago and this week he has had mild CP ,nauseaand he wants to be checked for STD.

## 2020-07-15 NOTE — ED Provider Notes (Signed)
[facility]   MRN: [id number] DOB: 05/03/1970  Subjective:   Luis Johns is a 50 y.o. male presenting for 1 week history of intermittent persistent chest pain, nausea without vomiting. Had his J&J COVID vaccine ~2 weeks ago.  Reports longstanding history of intermittent chest congestion and chest tightness.  Has never been evaluated for asthma but thinks that he could have this.  Denies any active chest pain, shortness of breath or wheezing.  No rashes.  Patient is not a smoker.  No drug use.  No history of sarcoidosis.  Would also like to be checked for STIs.  Reports that he has had some urinary discomfort but is not actually painful.  He states that last episode was in 2020, came to our clinic received an injection of an oral antibiotic.  He would like to have the same done again.  He is sexually active with the same male as last time, ultimately ended up testing positive for trichomonas.  Denies dysuria, hematuria, urinary frequency, penile discharge, penile swelling, testicular pain, testicular swelling, anal pain, groin pain.   No current facility-administered medications for this encounter.  Current Outpatient Medications:  .  Cholecalciferol (VITAMIN D3) 2000 UNITS capsule, Take 1 capsule (2,000 Units total) by mouth daily., Disp: 100 capsule, Rfl: 3 .  dexlansoprazole (DEXILANT) 60 MG capsule, Take 1 capsule (60 mg total) by mouth daily., Disp: 90 capsule, Rfl: 3 .  loratadine (CLARITIN) 10 MG tablet, Take 1 tablet (10 mg total) by mouth daily., Disp: 100 tablet, Rfl: 3 .  metroNIDAZOLE (FLAGYL) 500 MG tablet, Take 1 tablet (500 mg total) by mouth 2 (two) times daily., Disp: 14 tablet, Rfl: 0   No Known Allergies  No past medical history on file.   No past surgical history on file.  Family History  Problem Relation Age of Onset  . Stroke Brother   . Heart disease Brother   . Healthy Mother     Social History   Tobacco Use  . Smoking status: Never  Smoker  . Smokeless tobacco: Never Used  Substance Use Topics  . Alcohol use: Yes    Comment: very little  . Drug use: No    ROS   Objective:   Vitals: BP 131/77 (BP Location: Right Arm)   Pulse 63   Temp 98.7 F (37.1 C) (Oral)   Resp 16   SpO2 96%   Physical Exam Constitutional:      General: He is not in acute distress.    Appearance: Normal appearance. He is well-developed. He is obese. He is not ill-appearing, toxic-appearing or diaphoretic.  HENT:     Head: Normocephalic and atraumatic.     Right Ear: External ear normal.     Left Ear: External ear normal.     Nose: Nose normal.     Mouth/Throat:     Mouth: Mucous membranes are moist.     Pharynx: Oropharynx is clear.  Eyes:     General: No scleral icterus.    Extraocular Movements: Extraocular movements intact.     Pupils: Pupils are equal, round, and reactive to light.  Cardiovascular:     Rate and Rhythm: Normal rate and regular rhythm.     Heart sounds: Normal heart sounds. No murmur heard. No friction rub. No gallop.   Pulmonary:     Effort: Pulmonary effort is normal. No respiratory distress.     Breath sounds: Normal breath sounds. No stridor. No wheezing, rhonchi or rales.  Neurological:     Mental Status: He is alert and oriented to person, place, and time.  Psychiatric:        Mood and Affect: Mood normal.        Behavior: Behavior normal.        Thought Content: Thought content normal.     ED ECG REPORT   Date: 07/15/2020  Rate: 65bpm  Rhythm: normal sinus rhythm  QRS Axis: normal  Intervals: normal  ST/T Wave abnormalities: nonspecific T wave changes  Conduction Disutrbances:none  Narrative Interpretation:  Sinus rhythm at 65 bpm with nonspecific T wave flattening in lead I, aVL.  No acute findings.  No previous EKG available for comparison.  Old EKG Reviewed: none available  I have personally reviewed the EKG tracing and agree with the computerized printout as noted.   Assessment  and Plan :   PDMP not reviewed this encounter.  1. Atypical chest pain   2. Screen for STD (sexually transmitted disease)   3. Unprotected sex     Patient has clear cardiopulmonary exam.  I have low suspicion for chest PE, MI, acute cardiopulmonary event.  Patient has very reassuring vital signs.  Will trial albuterol inhaler, recommended follow-up with his PCP and consideration for referral to pulmonologist.  Regarding his STI screening, at his visit in 2020, he was given azithromycin 1000mg  and 250mg  IM Rocephin.  I counseled against repeating this regimen as it is not in line with the current recommended dosing for empiric treatment of gonorrhea and chlamydia.  In fact this patient has minimal symptoms, will defer treatment and based off of his lab results.  Patient is in agreement, will follow up by phone or through MyChart. Counseled patient on potential for adverse effects with medications prescribed/recommended today, ER and return-to-clinic precautions discussed, patient verbalized understanding.    , PA-C 07/15/20 1430

## 2020-07-16 LAB — CYTOLOGY, (ORAL, ANAL, URETHRAL) ANCILLARY ONLY
Chlamydia: NEGATIVE
Comment: NEGATIVE
Comment: NEGATIVE
Comment: NORMAL
Neisseria Gonorrhea: NEGATIVE
Trichomonas: NEGATIVE

## 2022-04-30 ENCOUNTER — Other Ambulatory Visit (HOSPITAL_COMMUNITY): Payer: Self-pay

## 2022-04-30 MED ORDER — CICLOPIROX 8 % EX SOLN
CUTANEOUS | 5 refills | Status: DC
  Filled 2022-04-30: qty 13.2, 90d supply, fill #0

## 2022-05-01 ENCOUNTER — Other Ambulatory Visit (HOSPITAL_COMMUNITY): Payer: Self-pay

## 2022-05-12 ENCOUNTER — Other Ambulatory Visit (HOSPITAL_COMMUNITY): Payer: Self-pay

## 2023-01-09 ENCOUNTER — Inpatient Hospital Stay (HOSPITAL_COMMUNITY)
Admission: EM | Admit: 2023-01-09 | Discharge: 2023-01-16 | DRG: 603 | Disposition: A | Discharge status: Home / Self Care | Payer: Commercial Managed Care - PPO | Attending: Internal Medicine | Admitting: Internal Medicine

## 2023-01-09 ENCOUNTER — Emergency Department (HOSPITAL_COMMUNITY): Payer: Commercial Managed Care - PPO

## 2023-01-09 ENCOUNTER — Encounter (HOSPITAL_COMMUNITY): Payer: Self-pay | Admitting: Emergency Medicine

## 2023-01-09 ENCOUNTER — Ambulatory Visit (HOSPITAL_COMMUNITY)
Admission: EM | Admit: 2023-01-09 | Discharge: 2023-01-09 | Disposition: A | Discharge status: Home / Self Care | Payer: Commercial Managed Care - PPO

## 2023-01-09 ENCOUNTER — Encounter (HOSPITAL_COMMUNITY): Payer: Self-pay

## 2023-01-09 ENCOUNTER — Other Ambulatory Visit: Payer: Self-pay

## 2023-01-09 DIAGNOSIS — S8012XA Contusion of left lower leg, initial encounter: Secondary | ICD-10-CM | POA: Diagnosis present

## 2023-01-09 DIAGNOSIS — L0291 Cutaneous abscess, unspecified: Secondary | ICD-10-CM | POA: Diagnosis not present

## 2023-01-09 DIAGNOSIS — Z6835 Body mass index (BMI) 35.0-35.9, adult: Secondary | ICD-10-CM

## 2023-01-09 DIAGNOSIS — M79605 Pain in left leg: Secondary | ICD-10-CM

## 2023-01-09 DIAGNOSIS — L02416 Cutaneous abscess of left lower limb: Principal | ICD-10-CM | POA: Diagnosis present

## 2023-01-09 DIAGNOSIS — L03116 Cellulitis of left lower limb: Principal | ICD-10-CM | POA: Diagnosis present

## 2023-01-09 DIAGNOSIS — W06XXXA Fall from bed, initial encounter: Secondary | ICD-10-CM | POA: Diagnosis present

## 2023-01-09 DIAGNOSIS — Z8249 Family history of ischemic heart disease and other diseases of the circulatory system: Secondary | ICD-10-CM

## 2023-01-09 DIAGNOSIS — E876 Hypokalemia: Secondary | ICD-10-CM | POA: Insufficient documentation

## 2023-01-09 DIAGNOSIS — Z79899 Other long term (current) drug therapy: Secondary | ICD-10-CM

## 2023-01-09 DIAGNOSIS — E669 Obesity, unspecified: Secondary | ICD-10-CM | POA: Diagnosis present

## 2023-01-09 DIAGNOSIS — E119 Type 2 diabetes mellitus without complications: Secondary | ICD-10-CM | POA: Diagnosis present

## 2023-01-09 DIAGNOSIS — Z823 Family history of stroke: Secondary | ICD-10-CM

## 2023-01-09 DIAGNOSIS — B353 Tinea pedis: Secondary | ICD-10-CM | POA: Diagnosis present

## 2023-01-09 DIAGNOSIS — B9561 Methicillin susceptible Staphylococcus aureus infection as the cause of diseases classified elsewhere: Secondary | ICD-10-CM | POA: Diagnosis present

## 2023-01-09 DIAGNOSIS — B351 Tinea unguium: Secondary | ICD-10-CM | POA: Diagnosis present

## 2023-01-09 LAB — CBC WITH DIFFERENTIAL/PLATELET
Abs Immature Granulocytes: 0.04 10*3/uL (ref 0.00–0.07)
Basophils Absolute: 0 10*3/uL (ref 0.0–0.1)
Basophils Relative: 0 %
Eosinophils Absolute: 0.4 10*3/uL (ref 0.0–0.5)
Eosinophils Relative: 4 %
HCT: 41.4 % (ref 39.0–52.0)
Hemoglobin: 13.3 g/dL (ref 13.0–17.0)
Immature Granulocytes: 1 %
Lymphocytes Relative: 23 %
Lymphs Abs: 1.9 10*3/uL (ref 0.7–4.0)
MCH: 29.4 pg (ref 26.0–34.0)
MCHC: 32.1 g/dL (ref 30.0–36.0)
MCV: 91.4 fL (ref 80.0–100.0)
Monocytes Absolute: 0.9 10*3/uL (ref 0.1–1.0)
Monocytes Relative: 11 %
Neutro Abs: 5.4 10*3/uL (ref 1.7–7.7)
Neutrophils Relative %: 61 %
Platelets: 299 10*3/uL (ref 150–400)
RBC: 4.53 MIL/uL (ref 4.22–5.81)
RDW: 13.2 % (ref 11.5–15.5)
WBC: 8.6 10*3/uL (ref 4.0–10.5)
nRBC: 0 % (ref 0.0–0.2)

## 2023-01-09 LAB — BASIC METABOLIC PANEL
Anion gap: 10 (ref 5–15)
BUN: 12 mg/dL (ref 6–20)
CO2: 24 mmol/L (ref 22–32)
Calcium: 8.6 mg/dL — ABNORMAL LOW (ref 8.9–10.3)
Chloride: 102 mmol/L (ref 98–111)
Creatinine, Ser: 1.1 mg/dL (ref 0.61–1.24)
GFR, Estimated: >60 mL/min (ref >60)
Glucose, Bld: 98 mg/dL (ref 70–99)
Potassium: 3.4 mmol/L — ABNORMAL LOW (ref 3.5–5.1)
Sodium: 136 mmol/L (ref 135–145)

## 2023-01-09 LAB — I-STAT CG4 LACTIC ACID, ED: Lactic Acid, Venous: 0.9 mmol/L (ref 0.5–1.9)

## 2023-01-09 MED ORDER — VANCOMYCIN HCL 2000 MG/400ML IV SOLN
2000.0000 mg | Freq: Once | INTRAVENOUS | Status: AC
  Administered 2023-01-09: 2000 mg via INTRAVENOUS
  Filled 2023-01-09: qty 400

## 2023-01-09 NOTE — Progress Notes (Signed)
ED Pharmacy Antibiotic Sign Off An antibiotic consult was received from an ED provider for vancomycin per pharmacy dosing for cellulitis. A chart review was completed to assess appropriateness.   The following one time order(s) were placed:  Vancomycin 2g IV x 1  Further antibiotic and/or antibiotic pharmacy consults should be ordered by the admitting provider if indicated.   Thank you for allowing pharmacy to be a part of this patient's care.   Daylene Posey, [facility]  Clinical Pharmacist 01/09/23 11:05 PM

## 2023-01-09 NOTE — ED Provider Notes (Signed)
[facility]    CSN: [id number] Arrival date & time: 01/09/23  1408      History   Chief Complaint Chief Complaint  Patient presents with   Leg Pain    HPI Luis Johns is a 52 y.o. male.   Patient presents to clinic for left lower leg erythema, edema and and pain.  Around a week ago he hit his leg on a piece of furniture after a drain.  He denies any breaks in the skin or injuries.  4 to 5 days after injury he had sudden increase in pain and he 'felt like my leg was going to fall off.'   Ambulatory. No hx of DM2. Denies fevers. Denies numbness or tingling.   The history is provided by the patient and medical records.  Leg Pain Associated symptoms: no fever     History reviewed. No pertinent past medical history.  Patient Active Problem List   Diagnosis Date Noted   GERD (gastroesophageal reflux disease) 05/03/2015   Wheezing 01/25/2015   Well adult exam 01/25/2015   Allergic rhinitis 01/25/2015    History reviewed. No pertinent surgical history.     Home Medications    Prior to Admission medications   Medication Sig Start Date End Date Taking? Authorizing Provider  albuterol (VENTOLIN HFA) 108 (90 Base) MCG/ACT inhaler Inhale 1-2 puffs into the lungs every 6 (six) hours as needed for wheezing or shortness of breath. 07/15/20   Wallis Bamberg, PA-C  Cholecalciferol (VITAMIN D3) 2000 UNITS capsule Take 1 capsule (2,000 Units total) by mouth daily. 01/25/15   Plotnikov, Georgina Quint, MD  ciclopirox (PENLAC) 8 % solution Apply daily to affected area(s) as directed 04/30/22     dexlansoprazole (DEXILANT) 60 MG capsule Take 1 capsule (60 mg total) by mouth daily. 05/03/15   Plotnikov, Georgina Quint, MD  loratadine (CLARITIN) 10 MG tablet Take 1 tablet (10 mg total) by mouth daily. 01/25/15   Plotnikov, Georgina Quint, MD  metroNIDAZOLE (FLAGYL) 500 MG tablet Take 1 tablet (500 mg total) by mouth 2 (two) times daily. 03/02/19   Mardella Layman, MD    Family History Family  History  Problem Relation Age of Onset   Stroke Brother    Heart disease Brother    Healthy Mother     Social History Social History   Tobacco Use   Smoking status: Never   Smokeless tobacco: Never  Vaping Use   Vaping status: Never Used  Substance Use Topics   Alcohol use: Yes    Comment: very little   Drug use: No     Allergies   Patient has no known allergies.   Review of Systems Review of Systems  Constitutional:  Negative for fever.  Skin:  Positive for color change.     Physical Exam Triage Vital Signs ED Triage Vitals  Encounter Vitals Group     BP 01/09/23 1622 123/82     Systolic BP Percentile --      Diastolic BP Percentile --      Pulse Rate 01/09/23 1622 62     Resp 01/09/23 1622 18     Temp 01/09/23 1622 98.4 F (36.9 C)     Temp Source 01/09/23 1622 Oral     SpO2 01/09/23 1622 97 %     Weight 01/09/23 1623 245 lb (111.1 kg)     Height 01/09/23 1623 5\' 10"  (1.778 m)     Head Circumference --      Peak Flow --  Pain Score 01/09/23 1623 6     Pain Loc --      Pain Education --      Exclude from Growth Chart --    No data found.  Updated Vital Signs BP 123/82 (BP Location: Left Arm)   Pulse 62   Temp 98.4 F (36.9 C) (Oral)   Resp 18   Ht 5\' 10"  (1.778 m)   Wt 245 lb (111.1 kg)   SpO2 97%   BMI 35.15 kg/m   Visual Acuity Right Eye Distance:   Left Eye Distance:   Bilateral Distance:    Right Eye Near:   Left Eye Near:    Bilateral Near:     Physical Exam Vitals and nursing note reviewed.  Constitutional:      Appearance: Normal appearance.  HENT:     Head: Normocephalic and atraumatic.     Right Ear: External ear normal.     Left Ear: External ear normal.     Nose: Nose normal.     Mouth/Throat:     Mouth: Mucous membranes are moist.  Eyes:     Conjunctiva/sclera: Conjunctivae normal.  Musculoskeletal:        General: Swelling, tenderness and signs of injury present. Normal range of motion.     Left lower leg:  Edema present.  Skin:    General: Skin is warm and dry.     Capillary Refill: Capillary refill takes 2 to 3 seconds.     Findings: Erythema present.     Comments: Patient with extensive circumferential swelling to LLE after injury 1 week prior on furniture.  Unable to palpate pedal pulses due to extent of swelling.  Capillary refill in great toe is 2 to 3 seconds.  Neurological:     General: No focal deficit present.     Mental Status: He is alert and oriented to person, place, and time.  Psychiatric:        Mood and Affect: Mood normal.        Behavior: Behavior normal. Behavior is cooperative.      UC Treatments / Results  Labs (all labs ordered are listed, but only abnormal results are displayed) Labs Reviewed - No data to display  EKG   Radiology No results found.  Procedures Procedures (including critical care time)  Medications Ordered in UC Medications - No data to display  Initial Impression / Assessment and Plan / UC Course  I have reviewed the triage vital signs and the nursing notes.  Pertinent labs & imaging results that were available during my care of the patient were reviewed by me and considered in my medical decision making (see chart for details).  Vitals and triage reviewed.  Patient with extensive swelling to left lower extremity, pain and erythema.  Swelling and pain are out of proportion to the injury.  Capillary refill is 2 to 3 seconds in toes.  Difficult to palpate pedal pulses due to the extent of edema.  Concern for compartment syndrome versus infection.  Patient adamant that there was no break in the skin, there is still concern for cellulitis.  Advised to head to the nearest emergency department for further imaging and testing. Patient agreeable to plan, will head to ED via POV.     Final Clinical Impressions(s) / UC Diagnoses   Final diagnoses:  Left leg pain   Discharge Instructions   None    ED Prescriptions   None    PDMP not  reviewed this encounter.  Jamerion Cabello, [place] N, [place] 01/09/23 1924

## 2023-01-09 NOTE — ED Notes (Signed)
Patient is being discharged from the Urgent Care and sent to the Emergency Department via private vehicle . Per [place] Garrison, NP patient is in need of higher level of care due to further evaluation. Patient is aware and verbalizes understanding of plan of care.  Vitals:   01/09/23 1622  BP: 123/82  Pulse: 62  Resp: 18  Temp: 98.4 F (36.9 C)  SpO2: 97%

## 2023-01-09 NOTE — ED Provider Notes (Incomplete)
[place] AT [facility] Provider Note   CSN: [id number] Arrival date & time: 01/09/23  1655     History {Add pertinent medical, surgical, social history, OB history to HPI:1} Chief Complaint  Patient presents with  . 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  He reports he was sleeping when he rolled off the bed and hit his left calf on a corner of the nightstand.  He noted that he started noticing swelling and warmth to the area two days ago. He denies noting hemorrhage or break in skin at time of injury.  He denies fever.  He reports significant pain with ambulation and weightbearing on left leg.  He has been taking Aleve for pain without much relief with the last dose being at 0800 this morning.   HPI     Home Medications Prior to Admission medications   Medication Sig Start Date End Date Taking? Authorizing Provider  albuterol (VENTOLIN HFA) 108 (90 Base) MCG/ACT inhaler Inhale 1-2 puffs into the lungs every 6 (six) hours as needed for wheezing or shortness of breath. 07/15/20   Wallis Bamberg, PA-C  Cholecalciferol (VITAMIN D3) 2000 UNITS capsule Take 1 capsule (2,000 Units total) by mouth daily. 01/25/15   Plotnikov, Georgina Quint, MD  ciclopirox (PENLAC) 8 % solution Apply daily to affected area(s) as directed 04/30/22     dexlansoprazole (DEXILANT) 60 MG capsule Take 1 capsule (60 mg total) by mouth daily. 05/03/15   Plotnikov, Georgina Quint, MD  loratadine (CLARITIN) 10 MG tablet Take 1 tablet (10 mg total) by mouth daily. 01/25/15   Plotnikov, Georgina Quint, MD  metroNIDAZOLE (FLAGYL) 500 MG tablet Take 1 tablet (500 mg total) by mouth 2 (two) times daily. 03/02/19   Mardella Layman, MD      Allergies    Patient has no known allergies.    Review of  Systems   Review of Systems  Constitutional:  Negative for chills, fatigue and fever.  Respiratory:  Negative for cough, chest tightness, shortness of breath and wheezing.   Cardiovascular:  Negative for chest pain and palpitations.  Gastrointestinal:  Negative for abdominal pain, constipation, diarrhea, nausea and vomiting.  Skin:  Positive for color change.       Swelling and erythema to left calf TTP with palpation  Neurological:  Negative for dizziness, seizures, weakness, light-headedness, numbness and headaches.    Physical Exam Updated Vital Signs BP (!) 119/102   Pulse 71   Temp 98.2 F (36.8 C) (Oral)   Resp 18   Ht 5\' 10"  (1.778 m)   Wt 111.1 kg   SpO2 99%   BMI 35.14 kg/m  Physical Exam Vitals and nursing note reviewed.  Constitutional:      General: He is not in acute distress.    Appearance: Normal appearance.  HENT:     Head: Normocephalic and atraumatic.  Eyes:     Conjunctiva/sclera: Conjunctivae normal.  Cardiovascular:     Rate and Rhythm: Normal rate.  Pulmonary:     Effort: Pulmonary effort is normal. No respiratory distress.  Skin:    Coloration: Skin is not jaundiced or pale.  Neurological:     Mental Status: He is alert. Mental status is at baseline.     ED Results /  Procedures / Treatments   Labs (all labs ordered are listed, but only abnormal results are displayed) Labs Reviewed  BASIC METABOLIC PANEL - Abnormal; Notable for the following components:      Result Value   Potassium 3.4 (*)    Calcium 8.6 (*)    All other components within normal limits  CBC WITH DIFFERENTIAL/PLATELET    EKG None  Radiology No results found.  Procedures Procedures  {Document cardiac monitor, telemetry assessment procedure when appropriate:1}  Medications Ordered in ED Medications - No data to display  ED Course/ Medical Decision Making/ A&P   {   Click here for ABCD2, HEART and other calculatorsREFRESH Note before signing :1}                               Medical Decision Making Amount and/or Complexity of Data Reviewed Labs: ordered.   Upon evaluation of patient, patient is sitting comfortably in bed.  He is not currently ill-appearing and denies fevers.  Left calf is erythematous, warm to touch, edematous, tender to palpation (pictured above).  Will obtain x-ray of left tibia-fibula to check for gas.  Blood cultures pending.  No leukocytosis or elevated lactate. Initiated vancomycin.  Patient is currently afebrile with no tachycardia  Will admit patient for continued vancomycin treatment.  {Document critical care time when appropriate:1} {Document review of labs and clinical decision tools ie heart score, Chads2Vasc2 etc:1}  {Document your independent review of radiology images, and any outside records:1} {Document your discussion with family members, caretakers, and with consultants:1} {Document social determinants of health affecting pt's care:1} {Document your decision making why or why not admission, treatments were needed:1} Final Clinical Impression(s) / ED Diagnoses Final diagnoses:  None    Rx / DC Orders ED Discharge Orders     None

## 2023-01-09 NOTE — ED Triage Notes (Signed)
Pt states he fell out of bed and hit his left leg on point of furniturex1wk ago. Pt c/o left leg swelling and painx4-5d. Pt walked well to triage from waiting room. Pt has 3+ swelling of left lower leg. Pt's left lower leg is erythematous. PT has 2+ left pedal pulse, cap refill less than 3 sec, hot to touch.

## 2023-01-09 NOTE — ED Triage Notes (Signed)
Patient states that he injured his left lower leg x 1 week ago after hitting his leg on a piece of furniture.  The area is extremely red, tender to touch and very painful.  Patient has been taken Aleve for pain and elevate leg.

## 2023-01-09 NOTE — ED Provider Notes (Signed)
[place] EMERGENCY DEPARTMENT AT [facility] Provider Note   CSN: [id number] Arrival date & time: 01/09/23  1655     History  Chief Complaint  Patient presents with   Leg Injury    Luis Johns is a 52 y.o. male with no significant past medical history presents to the emergency department following evaluation at [facility] urgent care for evaluation of swelling and painful left calf following an injury 5 days prior.  He reports he was sleeping when he rolled off the bed and hit his left calf on a corner of the nightstand.  He noted that he started noticing swelling and warmth to the area two days ago. He denies noting hemorrhage or break in skin at time of injury.  He denies fever.  He reports significant pain with ambulation and weightbearing on left leg.  He has been taking Aleve for pain without much relief with the last dose being at 0800 this morning.  He denies history of DVT, clot, PE.   HPI     Home Medications Prior to Admission medications   Medication Sig Start Date End Date Taking? Authorizing Provider  albuterol (VENTOLIN HFA) 108 (90 Base) MCG/ACT inhaler Inhale 1-2 puffs into the lungs every 6 (six) hours as needed for wheezing or shortness of breath. 07/15/20   Wallis Bamberg, PA-C  Cholecalciferol (VITAMIN D3) 2000 UNITS capsule Take 1 capsule (2,000 Units total) by mouth daily. 01/25/15   Plotnikov, Georgina Quint, MD  ciclopirox (PENLAC) 8 % solution Apply daily to affected area(s) as directed 04/30/22     dexlansoprazole (DEXILANT) 60 MG capsule Take 1 capsule (60 mg total) by mouth daily. 05/03/15   Plotnikov, Georgina Quint, MD  loratadine (CLARITIN) 10 MG tablet Take 1 tablet (10 mg total) by mouth daily. 01/25/15   Plotnikov, Georgina Quint, MD  metroNIDAZOLE (FLAGYL) 500 MG tablet Take 1 tablet (500 mg total) by mouth 2 (two) times daily. 03/02/19   Mardella Layman, MD      Allergies    Patient has no known allergies.    Review of Systems   Review of Systems   Constitutional:  Negative for chills, fatigue and fever.  Respiratory:  Negative for cough, chest tightness, shortness of breath and wheezing.   Cardiovascular:  Negative for chest pain and palpitations.  Gastrointestinal:  Negative for abdominal pain, constipation, diarrhea, nausea and vomiting.  Skin:  Positive for color change.       Swelling and erythema to left calf TTP with palpation  Neurological:  Negative for dizziness, seizures, weakness, light-headedness, numbness and headaches.    Physical Exam Updated Vital Signs BP (!) 161/119   Pulse 90   Temp 98.1 F (36.7 C)   Resp 18   Ht 5\' 10"  (1.778 m)   Wt 111.1 kg   SpO2 95%   BMI 35.14 kg/m  Physical Exam Vitals and nursing note reviewed.  Constitutional:      General: He is not in acute distress.    Appearance: Normal appearance.  HENT:     Head: Normocephalic and atraumatic.  Eyes:     Conjunctiva/sclera: Conjunctivae normal.  Cardiovascular:     Rate and Rhythm: Normal rate.     Pulses: Normal pulses.  Pulmonary:     Effort: Pulmonary effort is normal. No respiratory distress.  Musculoskeletal:        General: Swelling and tenderness present.     Right lower leg: No edema.     Left lower leg:  Edema present.     Comments:    Skin:    Coloration: Skin is not jaundiced or pale.     Findings: Erythema present.     Comments: Pain with weightbearing of left leg TTP of left lower extremity  Neurological:     Mental Status: He is alert. Mental status is at baseline.     ED Results / Procedures / Treatments   Labs (all labs ordered are listed, but only abnormal results are displayed) Labs Reviewed  BASIC METABOLIC PANEL - Abnormal; Notable for the following components:      Result Value   Potassium 3.4 (*)    Calcium 8.6 (*)    All other components within normal limits  CULTURE, BLOOD (SINGLE)  BODY FLUID CULTURE W GRAM STAIN  CBC WITH DIFFERENTIAL/PLATELET  I-STAT CG4 LACTIC ACID, ED  I-STAT CG4  LACTIC ACID, ED    EKG None  Radiology DG Tibia/Fibula Left  Result Date: 01/09/2023 CLINICAL DATA:  Injured left lower leg 1 week ago after hitting leg on a piece of piece of furniture. Leg is extremely red and tender to touch. EXAM: LEFT TIBIA AND FIBULA - 2 VIEW COMPARISON:  None Available. FINDINGS: No acute fracture or dislocation. Soft tissue swelling about the ankle. Focal soft tissue thickening about the distal calf medially IMPRESSION: 1. No acute osseous abnormality. 2. Focal swelling about the medial left calf with more diffuse swelling about the ankle. Electronically Signed   By: Minerva Fester M.D.   On: 01/09/2023 23:57    Procedures  .Marland KitchenIncision and Drainage  Date/Time: 01/10/2023 1:20 AM  Performed by: Judithann Sheen, PA Authorized by: Judithann Sheen, PA   Consent:    Consent obtained:  Verbal   Consent given by:  Patient   Risks, benefits, and alternatives were discussed: yes     Risks discussed:  Bleeding and pain   Alternatives discussed:  No treatment Universal protocol:    Procedure explained and questions answered to patient or proxy's satisfaction: yes     Imaging studies available: yes     Site/side marked: yes     Patient identity confirmed:  Verbally with patient and hospital-assigned identification number Location:    Type:  Abscess   Location:  Lower extremity   Lower extremity location:  Leg   Leg location:  L lower leg Sedation:    Sedation type:  None Anesthesia:    Anesthesia method:  Topical application and local infiltration   Topical anesthetic:  LET   Local anesthetic:  Lidocaine 2% WITH epi Procedure details:    Drainage:  Purulent   Drainage amount:  Moderate   Wound treatment:  Wound left open   Packing materials:  1/4 in iodoform gauze   Amount 1/4" iodoform:  2 inch Post-procedure details:    Procedure completion:  Tolerated well, no immediate complications Ultrasound ED Soft Tissue  Date/Time: 01/10/2023 1:26 AM  Performed  by: Roxy Horseman, PA-C Authorized by: Roxy Horseman, PA-C   Procedure details:    Indications: limb pain, localization of abscess and evaluate for cellulitis     Images: archived   Location:    Location: lower extremity     Side:  Left Findings:     abscess present    cellulitis present     Medications Ordered in ED Medications  vancomycin (VANCOREADY) IVPB 2000 mg/400 mL (2,000 mg Intravenous New Bag/Given 01/09/23 2317)  lidocaine-EPINEPHrine (XYLOCAINE W/EPI) 2 %-1:200000 (PF) injection 10 mL (10 mLs Infiltration  Given by Other 01/10/23 0022)  lidocaine-EPINEPHrine-tetracaine (LET) topical gel (3 mLs Topical Given 01/10/23 0015)    ED Course/ Medical Decision Making/ A&P                                 Medical Decision Making Amount and/or Complexity of Data Reviewed Labs: ordered.   Upon evaluation of patient, patient is sitting comfortably in bed.  He is not currently ill-appearing and denies fevers.  Left calf is erythematous, warm to touch, edematous, tender to palpation with prominent nodule (pictured above).  XR of left tibia-fibula negative for gas.  Blood cultures pending.  No leukocytosis or elevated lactate. Initiated vancomycin.  Patient is currently afebrile with no tachycardia. No current concern for compartment syndrome as able to palpate dorsal pedis pulse and no pain out of proportion upon exam.  Ultrasound of soft tissue is significant for abscess and cellulitis.  I&D significant for 20 cc of purulent drainage sent for culture.  Wound packed with 1/4 inch iodoform gauze. see procedure notes above  Will admit patient for continued IV vancomycin treatment. Rob Dahlia Client consulted Dr. Lazarus Salines who accepted patient for admission        Final Clinical Impression(s) / ED Diagnoses Final diagnoses:  Cellulitis of left lower extremity  Abscess    Rx / DC Orders ED Discharge Orders     None         Judithann Sheen, PA 01/10/23 0151    Melene Plan, DO 01/12/23 1506

## 2023-01-09 NOTE — ED Provider Notes (Signed)
  [place] EMERGENCY DEPARTMENT AT [facility] Provider Note    Patient seen in conjunction with orienting PA, Edyth Gunnels.    Patient here with injury to the left leg.  Concern for cellulitis and abscess.  Given the rapid spread and severity of the infection, feel that observation admission is indicated.  US soft tissue notable for abscess and cellulitis.  Will consult hospitalist for admission.  Procedures Ultrasound ED Soft Tissue  Date/Time: 01/10/2023 12:00 AM  Performed by: Roxy Horseman, PA-C Authorized by: Roxy Horseman, PA-C   Procedure details:    Indications: limb pain, localization of abscess and evaluate for cellulitis     Transverse view:  Visualized   Longitudinal view:  Visualized   Images: archived   Location:    Location: lower extremity     Side:  Left Findings:     abscess present    cellulitis present      Roxy Horseman, PA-C 01/10/23 0001    Rancour, Jeannett Senior, MD 01/10/23 [phone]

## 2023-01-10 ENCOUNTER — Observation Stay (HOSPITAL_BASED_OUTPATIENT_CLINIC_OR_DEPARTMENT_OTHER): Payer: Commercial Managed Care - PPO

## 2023-01-10 DIAGNOSIS — E876 Hypokalemia: Secondary | ICD-10-CM | POA: Insufficient documentation

## 2023-01-10 DIAGNOSIS — L02416 Cutaneous abscess of left lower limb: Secondary | ICD-10-CM | POA: Diagnosis present

## 2023-01-10 DIAGNOSIS — M79605 Pain in left leg: Secondary | ICD-10-CM

## 2023-01-10 DIAGNOSIS — L03116 Cellulitis of left lower limb: Secondary | ICD-10-CM | POA: Diagnosis not present

## 2023-01-10 LAB — CBC
HCT: 40.1 % (ref 39.0–52.0)
Hemoglobin: 13 g/dL (ref 13.0–17.0)
MCH: 30.1 pg (ref 26.0–34.0)
MCHC: 32.4 g/dL (ref 30.0–36.0)
MCV: 92.8 fL (ref 80.0–100.0)
Platelets: 282 10*3/uL (ref 150–400)
RBC: 4.32 MIL/uL (ref 4.22–5.81)
RDW: 13.1 % (ref 11.5–15.5)
WBC: 9.2 10*3/uL (ref 4.0–10.5)
nRBC: 0 % (ref 0.0–0.2)

## 2023-01-10 LAB — HIV ANTIBODY (ROUTINE TESTING W REFLEX): HIV Screen 4th Generation wRfx: NONREACTIVE

## 2023-01-10 MED ORDER — ENOXAPARIN SODIUM 60 MG/0.6ML IJ SOSY
55.0000 mg | PREFILLED_SYRINGE | INTRAMUSCULAR | Status: DC
  Filled 2023-01-10 (×8): qty 0.6

## 2023-01-10 MED ORDER — SODIUM CHLORIDE 0.9 % IV SOLN
1.0000 g | Freq: Every day | INTRAVENOUS | Status: DC
  Administered 2023-01-10: 1 g via INTRAVENOUS
  Filled 2023-01-10: qty 10

## 2023-01-10 MED ORDER — LIDOCAINE-EPINEPHRINE-TETRACAINE (LET) TOPICAL GEL
3.0000 mL | Freq: Once | TOPICAL | Status: AC
  Administered 2023-01-10: 3 mL via TOPICAL
  Filled 2023-01-10: qty 3

## 2023-01-10 MED ORDER — POTASSIUM CHLORIDE CRYS ER 20 MEQ PO TBCR
40.0000 meq | EXTENDED_RELEASE_TABLET | Freq: Once | ORAL | Status: AC
  Administered 2023-01-10: 40 meq via ORAL
  Filled 2023-01-10: qty 2

## 2023-01-10 MED ORDER — ACETAMINOPHEN 500 MG PO TABS: 1000.0000 mg | ORAL_TABLET | Freq: Four times a day (QID) | ORAL | Status: DC | PRN

## 2023-01-10 MED ORDER — VANCOMYCIN HCL IN DEXTROSE 1-5 GM/200ML-% IV SOLN
1000.0000 mg | Freq: Two times a day (BID) | INTRAVENOUS | Status: DC
  Administered 2023-01-10 – 2023-01-12 (×5): 1000 mg via INTRAVENOUS
  Filled 2023-01-10 (×5): qty 200

## 2023-01-10 MED ORDER — LIDOCAINE-EPINEPHRINE (PF) 2 %-1:200000 IJ SOLN
10.0000 mL | Freq: Once | INTRAMUSCULAR | Status: AC
  Administered 2023-01-10: 10 mL
  Filled 2023-01-10: qty 20

## 2023-01-10 NOTE — Progress Notes (Addendum)
Pharmacy Antibiotic Note  Luis Johns is a 52 y.o. male admitted on 01/09/2023 with injury to left leg concerning for cellulitis. US showed no acute osseous abnormality, but focal swelling around left calf and ankle. I&D significant for purulent drainage sent for culture. Pharmacy has been consulted for vancomycin dosing.  WBC 8.6, sCr 1.10 (bl~1.1), afebrile, given loading dose of vanc in ED, fluid culture and single blood culture collected  Plan: -Vancomycin 2g IV x1 in ED -Vancomycin 1000mg  IV every 12 hours (AUC 502, Vd 0.5, IBW) -Monitor renal function -Follow up cultures, signs of clinical improvement, de-escalation of antibiotics, and LOT.  Height: 5\' 10"  (177.8 cm) Weight: 111.1 kg (244 lb 14.9 oz) IBW/kg (Calculated) : 73  Temp (24hrs), Avg:98.2 F (36.8 C), Min:98.1 F (36.7 C), Max:98.4 F (36.9 C)  Recent Labs  Lab 01/09/23 1715 01/09/23 2310  WBC 8.6  --   CREATININE 1.10  --   LATICACIDVEN  --  0.9    Estimated Creatinine Clearance: 98 mL/min (by C-G formula based on SCr of 1.1 mg/dL).    No Known Allergies  Antimicrobials this admission: Vancomycin 9/13 >>   Microbiology results: 9/13 BCx:  9/13 Fluid Cx:    Thank you for allowing pharmacy to be a part of this patient's care.  Arabella Merles, PharmD. Clinical Pharmacist 01/10/2023 1:56 AM

## 2023-01-10 NOTE — H&P (Addendum)
History and Physical    Patient: Luis Johns KGM:[id number] DOB: November 15, 1970 DOA: 01/09/2023 DOS: the patient was seen and examined on 01/10/2023 PCP: Plotnikov, Georgina Quint, MD  Patient coming from: Home  Chief Complaint:  Chief Complaint  Patient presents with   Leg Injury   HPI: Luis Johns is a 52 y.o. male with limited medical history, who presents with concern for infection involving his left leg.  Ports approximately 1 week ago he fell out of bed and hit his left leg on his bedside dresser.  Initially no issues, and just had a scratch over the lower shin, and actually points to the area where he had an I&D today.  Over the next few days had redness and swelling which is progressed.  No fever or chills.  No other associated symptoms.   Review of Systems: Complete negative except above History reviewed. No pertinent past medical history. History reviewed. No pertinent surgical history. Social History:  reports that he has never smoked. He has never used smokeless tobacco. He reports current alcohol use. He reports that he does not use drugs.  No Known Allergies  Family History  Problem Relation Age of Onset   Stroke Brother    Heart disease Brother    Healthy Mother     Prior to Admission medications   Medication Sig Start Date End Date Taking? Authorizing Provider  albuterol (VENTOLIN HFA) 108 (90 Base) MCG/ACT inhaler Inhale 1-2 puffs into the lungs every 6 (six) hours as needed for wheezing or shortness of breath. 07/15/20   Wallis Bamberg, PA-C  Cholecalciferol (VITAMIN D3) 2000 UNITS capsule Take 1 capsule (2,000 Units total) by mouth daily. 01/25/15   Plotnikov, Georgina Quint, MD  ciclopirox (PENLAC) 8 % solution Apply daily to affected area(s) as directed 04/30/22     dexlansoprazole (DEXILANT) 60 MG capsule Take 1 capsule (60 mg total) by mouth daily. 05/03/15   Plotnikov, Georgina Quint, MD  loratadine (CLARITIN) 10 MG tablet Take 1 tablet (10 mg total) by mouth daily. 01/25/15    Plotnikov, Georgina Quint, MD  metroNIDAZOLE (FLAGYL) 500 MG tablet Take 1 tablet (500 mg total) by mouth 2 (two) times daily. 03/02/19   Mardella Layman, MD    Physical Exam: Vitals:   01/09/23 1706 01/09/23 2302 01/10/23 0218 01/10/23 0324  BP:  (!) 161/119 132/72 119/65  Pulse:  90 70 77  Resp:  18 15 18   Temp:  98.1 F (36.7 C) 98.6 F (37 C) 99.1 F (37.3 C)  TempSrc:   Oral Oral  SpO2:  95% 96% 98%  Weight: 111.1 kg     Height: 5\' 10"  (1.778 m)       Gen: Awake, alert, NAD  CV: Regular, normal S1, S2, no murmurs  Resp: Normal WOB, CTAB  Abd: Flat, normoactive, nontender MSK: There is markedly asymmetric pitting edema involving the left lower extremity which is 2-3+ to the knee.  No edema on the right.  There is a dressing over the left lower shin at area of I&D by the ED. erythema without distinct borders runs up to the knee. Skin: See MSK for additional findings.  Athlete's foot and onychomycosis bilaterally, no wounds of the feet neuro: Alert and interactive  Psych: euthymic, appropriate    Data Reviewed:   Reviewed notable for, remarkable blood counts and chemistries  ED course:  I&D, with 20 cc of purulent output which was sent for culture  Assessment and Plan:  55 M with limited medical history,  who presents with cellulitis and abscess of the left lower extremity.  Purulent cellulitis, with abscess, left lower extremity -Status post I&D by ED, follow culture -Due to the extent of his cellulitis and evolution, will admit for observation  -Continue IV vancomycin for now, pharmacy to dose -Duplex LLE to r/o underlying DVT   Advance Care Planning:   Code Status: Full Code confirmed with patient  Consults: None  Family Communication: Discussed with mother at bedside with his permission  Severity of Illness: The appropriate patient status for this patient is OBSERVATION. Observation status is judged to be reasonable and necessary in order to provide the required  intensity of service to ensure the patient's safety. The patient's presenting symptoms, physical exam findings, and initial radiographic and laboratory data in the context of their medical condition is felt to place them at decreased risk for further clinical deterioration. Furthermore, it is anticipated that the patient will be medically stable for discharge from the hospital within 2 midnights of admission.   Author: Dolly Rias, MD 01/10/2023 4:11 AM  For on call review [email or web address].

## 2023-01-10 NOTE — ED Notes (Signed)
ED TO INPATIENT HANDOFF REPORT  ED Nurse Name and Phone #: [phone]  S Name/Age/Gender Luis Johns 52 y.o. male Room/Bed: H011C/H011C  Code Status   Code Status: Full Code  Home/SNF/Other Home Patient oriented to: self, place, time, and situation Is this baseline? Yes   Triage Complete: Triage complete  Chief Complaint Cellulitis and abscess of left leg [L03.116, L02.416]  Triage Note Pt states he fell out of bed and hit his left leg on point of furniturex1wk ago. Pt c/o left leg swelling and painx4-5d. Pt walked well to triage from waiting room. Pt has 3+ swelling of left lower leg. Pt's left lower leg is erythematous. PT has 2+ left pedal pulse, cap refill less than 3 sec, hot to touch.    Allergies No Known Allergies  Level of Care/Admitting Diagnosis ED Disposition     ED Disposition  Admit   Condition  --   Comment  Hospital Area: [facility] [[id number]]  Level of Care: Med-Surg [16]  May place patient in observation at [facility] or [facility] if equivalent level of care is available:: Yes  Covid Evaluation: Asymptomatic - no recent exposure (last 10 days) testing not required  Diagnosis: Cellulitis and abscess of left leg [2706237]  Admitting Physician: Dolly Rias [[id number]]  Attending Physician: Dolly Rias [[id number]]          B Medical/Surgery History History reviewed. No pertinent past medical history. History reviewed. No pertinent surgical history.   A IV Location/Drains/Wounds Patient Lines/Drains/Airways Status     Active Line/Drains/Airways     Name Placement date Placement time Site Days   Peripheral IV 01/09/23 20 G Left Antecubital 01/09/23  2315  Antecubital  1            Intake/Output Last 24 hours  Intake/Output Summary (Last 24 hours) at 01/10/2023 0145 Last data filed at 01/10/2023 0126 Gross per 24 hour  Intake 400 ml  Output --  Net 400 ml    Labs/Imaging Results for orders placed or  performed during the hospital encounter of 01/09/23 (from the past 48 hour(s))  Basic metabolic panel     Status: Abnormal   Collection Time: 01/09/23  5:15 PM  Result Value Ref Range   Sodium 136 135 - 145 mmol/L   Potassium 3.4 (L) 3.5 - 5.1 mmol/L   Chloride 102 98 - 111 mmol/L   CO2 24 22 - 32 mmol/L   Glucose, Bld 98 70 - 99 mg/dL    Comment: Glucose reference range applies only to samples taken after fasting for at least 8 hours.   BUN 12 6 - 20 mg/dL   Creatinine, Ser 7.10 0.61 - 1.24 mg/dL   Calcium 8.6 (L) 8.9 - 10.3 mg/dL   GFR, Estimated >62 >69 mL/min    Comment: (NOTE) Calculated using the CKD-EPI Creatinine Equation (2021)    Anion gap 10 5 - 15    Comment: Performed at [facility], [address], [place], [place] [address]  CBC with Differential     Status: None   Collection Time: 01/09/23  5:15 PM  Result Value Ref Range   WBC 8.6 4.0 - 10.5 K/uL   RBC 4.53 4.22 - 5.81 MIL/uL   Hemoglobin 13.3 13.0 - 17.0 g/dL   HCT 27.0 35.0 - 09.3 %   MCV 91.4 80.0 - 100.0 fL   MCH 29.4 26.0 - 34.0 pg   MCHC 32.1 30.0 - 36.0 g/dL   RDW 81.8 29.9 -  15.5 %   Platelets 299 150 - 400 K/uL   nRBC 0.0 0.0 - 0.2 %   Neutrophils Relative % 61 %   Neutro Abs 5.4 1.7 - 7.7 K/uL   Lymphocytes Relative 23 %   Lymphs Abs 1.9 0.7 - 4.0 K/uL   Monocytes Relative 11 %   Monocytes Absolute 0.9 0.1 - 1.0 K/uL   Eosinophils Relative 4 %   Eosinophils Absolute 0.4 0.0 - 0.5 K/uL   Basophils Relative 0 %   Basophils Absolute 0.0 0.0 - 0.1 K/uL   Immature Granulocytes 1 %   Abs Immature Granulocytes 0.04 0.00 - 0.07 K/uL    Comment: Performed at [facility], [address] [place], [place] [address]  I-Stat CG4 Lactic Acid     Status: None   Collection Time: 01/09/23 11:10 PM  Result Value Ref Range   Lactic Acid, Venous 0.9 0.5 - 1.9 mmol/L   DG Tibia/Fibula Left  Result Date: 01/09/2023 CLINICAL DATA:  Injured left lower leg 1 week ago after hitting leg on a piece of  piece of furniture. Leg is extremely red and tender to touch. EXAM: LEFT TIBIA AND FIBULA - 2 VIEW COMPARISON:  None Available. FINDINGS: No acute fracture or dislocation. Soft tissue swelling about the ankle. Focal soft tissue thickening about the distal calf medially IMPRESSION: 1. No acute osseous abnormality. 2. Focal swelling about the medial left calf with more diffuse swelling about the ankle. Electronically Signed   By: Minerva Fester M.D.   On: 01/09/2023 23:57    Pending Labs Unresulted Labs (From admission, onward)     Start     Ordered   01/10/23 0500  HIV Antibody (routine testing w rflx)  (HIV Antibody (Routine testing w reflex) panel)  Tomorrow morning,   R        01/10/23 0142   01/10/23 0143  CBC  Once,   R        01/10/23 0142   01/10/23 0104  Body fluid culture w Gram Stain  Once,   URGENT       Question:  Are there also cytology or pathology orders on this specimen?  Answer:  No   01/10/23 0104   01/09/23 2254  Culture, blood (single)  Once,   STAT        01/09/23 2253            Vitals/Pain Today's Vitals   01/09/23 1704 01/09/23 1706 01/09/23 2302  BP: (!) 119/102  (!) 161/119  Pulse: 71  90  Resp: 18  18  Temp: 98.2 F (36.8 C)  98.1 F (36.7 C)  TempSrc: Oral    SpO2: 99%  95%  Weight:  111.1 kg   Height:  5\' 10"  (1.778 m)   PainSc:  0-No pain     Isolation Precautions No active isolations  Medications Medications  enoxaparin (LOVENOX) injection 40 mg (has no administration in time range)  potassium chloride SA (KLOR-CON M) CR tablet 40 mEq (has no administration in time range)  acetaminophen (TYLENOL) tablet 1,000 mg (has no administration in time range)  vancomycin (VANCOREADY) IVPB 2000 mg/400 mL (0 mg Intravenous Stopped 01/10/23 0126)  lidocaine-EPINEPHrine (XYLOCAINE W/EPI) 2 %-1:200000 (PF) injection 10 mL (10 mLs Infiltration Given by Other 01/10/23 0022)  lidocaine-EPINEPHrine-tetracaine (LET) topical gel (3 mLs Topical Given 01/10/23  0015)    Mobility walks with person assist     Focused Assessments    R Recommendations: See Admitting Provider  Note  Report given to:   Additional Notes:

## 2023-01-10 NOTE — Progress Notes (Signed)
VASCULAR LAB    Left lower extremity venous duplex has been performed.  See CV proc for preliminary results.   Baker Moronta, RVT 01/10/2023, 3:24 PM

## 2023-01-10 NOTE — Progress Notes (Signed)
PROGRESS NOTE  Luis Johns ZOX:[id number] DOB: 11-04-1970 DOA: 01/09/2023 PCP: Tresa Garter, MD  HPI/Recap of past 24 hours: Luis Johns is a 52 y.o. male with limited medical history, who presents with concern for infection involving his left leg. Reports approximately 1 week ago he fell out of bed and hit his left leg on his bedside dresser. Initially no issues, and just had a scratch over the lower shin. Over the next few days had significant redness, swelling and pain which progressed. No fever or chills. In the ED, pt had an I&D, with 20cc of purulent material drained, sent out for culture. Wound was packed and patient admitted for further management.     Today, patient any new complaints, reporting some mild pain, still with significant erythema and significant swelling. Denies any other new complaints   Assessment/Plan: Principal Problem:   Cellulitis and abscess of left leg Active Problems:   Hypokalemia   Purulent cellulitis, with abscess, left lower extremity Currently afebrile, with no leukocytosis BC x 2 pending S/p I&D by EDP, culture growing few gram positive cocci in clusters Duplex LLE to r/o underlying DVT Continue IV vancomycin, add ceftriaxone for now due to extent of cellulitis  May consult Gen surg to further evaluate   Obesity Lifestyle modification advised     Estimated body mass index is 35.14 kg/m as calculated from the following:   Height as of this encounter: 5\' 10"  (1.778 m).   Weight as of this encounter: 111.1 kg.     Code Status: Full  Family Communication: None at bedside  Disposition Plan: Status is: Observation The patient will require care spanning > 2 midnights and should be moved to inpatient because: Level of care      Consultants: None  Procedures: I & D on 9/13  Antimicrobials: Vancomycin Ceftriaxone  DVT prophylaxis:  Lovenox   Objective: Vitals:   01/09/23 2302 01/10/23 0218 01/10/23 0324  01/10/23 1043  BP: (!) 161/119 132/72 119/65 132/79  Pulse: 90 70 77 69  Resp: 18 15 18 16   Temp: 98.1 F (36.7 C) 98.6 F (37 C) 99.1 F (37.3 C) 98.6 F (37 C)  TempSrc:  Oral Oral Oral  SpO2: 95% 96% 98% 99%  Weight:      Height:        Intake/Output Summary (Last 24 hours) at 01/10/2023 1421 Last data filed at 01/10/2023 0126 Gross per 24 hour  Intake 400 ml  Output --  Net 400 ml   Filed Weights   01/09/23 1706  Weight: 111.1 kg    Exam: General: NAD  Cardiovascular: S1, S2 present Respiratory: CTAB Abdomen: Soft, nontender, nondistended, bowel sounds present Musculoskeletal: Noted marked pitting edema with erythema and pain noted on L lower shin Skin: As noted above Psychiatry: Normal mood     Data Reviewed: CBC: Recent Labs  Lab 01/09/23 1715 01/10/23 0222  WBC 8.6 9.2  NEUTROABS 5.4  --   HGB 13.3 13.0  HCT 41.4 40.1  MCV 91.4 92.8  PLT 299 282   Basic Metabolic Panel: Recent Labs  Lab 01/09/23 1715  NA 136  K 3.4*  CL 102  CO2 24  GLUCOSE 98  BUN 12  CREATININE 1.10  CALCIUM 8.6*   GFR: Estimated Creatinine Clearance: 98 mL/min (by C-G formula based on SCr of 1.1 mg/dL). Liver Function Tests: No results for input(s): "AST", "ALT", "ALKPHOS", "BILITOT", "PROT", "ALBUMIN" in the last 168 hours. No results for input(s): "LIPASE", "AMYLASE" in  the last 168 hours. No results for input(s): "AMMONIA" in the last 168 hours. Coagulation Profile: No results for input(s): "INR", "PROTIME" in the last 168 hours. Cardiac Enzymes: No results for input(s): "CKTOTAL", "CKMB", "CKMBINDEX", "TROPONINI" in the last 168 hours. BNP (last 3 results) No results for input(s): "PROBNP" in the last 8760 hours. HbA1C: No results for input(s): "HGBA1C" in the last 72 hours. CBG: No results for input(s): "GLUCAP" in the last 168 hours. Lipid Profile: No results for input(s): "CHOL", "HDL", "LDLCALC", "TRIG", "CHOLHDL", "LDLDIRECT" in the last 72  hours. Thyroid Function Tests: No results for input(s): "TSH", "T4TOTAL", "FREET4", "T3FREE", "THYROIDAB" in the last 72 hours. Anemia Panel: No results for input(s): "VITAMINB12", "FOLATE", "FERRITIN", "TIBC", "IRON", "RETICCTPCT" in the last 72 hours. Urine analysis:    Component Value Date/Time   COLORURINE YELLOW 01/25/2015 1027   APPEARANCEUR CLEAR 01/25/2015 1027   LABSPEC 1.020 01/25/2015 1027   PHURINE 7.0 01/25/2015 1027   GLUCOSEU NEGATIVE 01/25/2015 1027   HGBUR MODERATE (A) 01/25/2015 1027   BILIRUBINUR NEGATIVE 01/25/2015 1027   KETONESUR NEGATIVE 01/25/2015 1027   UROBILINOGEN 0.2 01/25/2015 1027   NITRITE NEGATIVE 01/25/2015 1027   LEUKOCYTESUR NEGATIVE 01/25/2015 1027   Sepsis Labs: @LABRCNTIP (procalcitonin:4,lacticidven:4)  ) Recent Results (from the past 240 hour(s))  Culture, blood (single)     Status: None (Preliminary result)   Collection Time: 01/09/23 11:06 PM   Specimen: BLOOD  Result Value Ref Range Status   Specimen Description BLOOD LEFT ANTECUBITAL  Final   Special Requests   Final    BOTTLES DRAWN AEROBIC AND ANAEROBIC Blood Culture results may not be optimal due to an excessive volume of blood received in culture bottles   Culture   Final    NO GROWTH < 12 HOURS Performed at [facility], [address] [place], [place] [address]    Report Status PENDING  Incomplete  Body fluid culture w Gram Stain     Status: None (Preliminary result)   Collection Time: 01/10/23  1:04 AM   Specimen: Abscess; Body Fluid  Result Value Ref Range Status   Specimen Description ABSCESS  Final   Special Requests LEFT LOWER LEG  Final   Gram Stain   Final    ABUNDANT WBC PRESENT,BOTH PMN AND MONONUCLEAR FEW GRAM POSITIVE COCCI IN CLUSTERS Performed at [facility], [address] [place], [place] [address]    Culture PENDING  Incomplete   Report Status PENDING  Incomplete      Studies: DG Tibia/Fibula Left  Result Date: 01/09/2023 CLINICAL  DATA:  Injured left lower leg 1 week ago after hitting leg on a piece of piece of furniture. Leg is extremely red and tender to touch. EXAM: LEFT TIBIA AND FIBULA - 2 VIEW COMPARISON:  None Available. FINDINGS: No acute fracture or dislocation. Soft tissue swelling about the ankle. Focal soft tissue thickening about the distal calf medially IMPRESSION: 1. No acute osseous abnormality. 2. Focal swelling about the medial left calf with more diffuse swelling about the ankle. Electronically Signed   By: Minerva Fester M.D.   On: 01/09/2023 23:57    Scheduled Meds:  enoxaparin (LOVENOX) injection  55 mg Subcutaneous Q24H    Continuous Infusions:  vancomycin 1,000 mg (01/10/23 1054)     LOS: 0 days     Briant Cedar, MD [facility]  If 7PM-7AM, please contact night-coverage [email or web address] 01/10/2023, 2:21 PM

## 2023-01-11 ENCOUNTER — Inpatient Hospital Stay (HOSPITAL_COMMUNITY): Payer: Commercial Managed Care - PPO

## 2023-01-11 DIAGNOSIS — Z8249 Family history of ischemic heart disease and other diseases of the circulatory system: Secondary | ICD-10-CM | POA: Diagnosis not present

## 2023-01-11 DIAGNOSIS — Z79899 Other long term (current) drug therapy: Secondary | ICD-10-CM | POA: Diagnosis not present

## 2023-01-11 DIAGNOSIS — B351 Tinea unguium: Secondary | ICD-10-CM | POA: Diagnosis present

## 2023-01-11 DIAGNOSIS — B353 Tinea pedis: Secondary | ICD-10-CM | POA: Diagnosis present

## 2023-01-11 DIAGNOSIS — Z823 Family history of stroke: Secondary | ICD-10-CM | POA: Diagnosis not present

## 2023-01-11 DIAGNOSIS — Z6835 Body mass index (BMI) 35.0-35.9, adult: Secondary | ICD-10-CM | POA: Diagnosis not present

## 2023-01-11 DIAGNOSIS — W06XXXA Fall from bed, initial encounter: Secondary | ICD-10-CM | POA: Diagnosis present

## 2023-01-11 DIAGNOSIS — S8012XA Contusion of left lower leg, initial encounter: Secondary | ICD-10-CM | POA: Diagnosis present

## 2023-01-11 DIAGNOSIS — L0291 Cutaneous abscess, unspecified: Secondary | ICD-10-CM | POA: Diagnosis present

## 2023-01-11 DIAGNOSIS — E669 Obesity, unspecified: Secondary | ICD-10-CM | POA: Diagnosis present

## 2023-01-11 DIAGNOSIS — L02416 Cutaneous abscess of left lower limb: Secondary | ICD-10-CM | POA: Diagnosis present

## 2023-01-11 DIAGNOSIS — B9561 Methicillin susceptible Staphylococcus aureus infection as the cause of diseases classified elsewhere: Secondary | ICD-10-CM | POA: Diagnosis present

## 2023-01-11 DIAGNOSIS — E119 Type 2 diabetes mellitus without complications: Secondary | ICD-10-CM | POA: Diagnosis present

## 2023-01-11 DIAGNOSIS — L03116 Cellulitis of left lower limb: Secondary | ICD-10-CM

## 2023-01-11 DIAGNOSIS — E876 Hypokalemia: Secondary | ICD-10-CM | POA: Diagnosis present

## 2023-01-11 LAB — CBC WITH DIFFERENTIAL/PLATELET
Abs Immature Granulocytes: 0.03 10*3/uL (ref 0.00–0.07)
Basophils Absolute: 0 10*3/uL (ref 0.0–0.1)
Basophils Relative: 0 %
Eosinophils Absolute: 0.6 10*3/uL — ABNORMAL HIGH (ref 0.0–0.5)
Eosinophils Relative: 9 %
HCT: 39 % (ref 39.0–52.0)
Hemoglobin: 12.8 g/dL — ABNORMAL LOW (ref 13.0–17.0)
Immature Granulocytes: 0 %
Lymphocytes Relative: 30 %
Lymphs Abs: 2.2 10*3/uL (ref 0.7–4.0)
MCH: 30.1 pg (ref 26.0–34.0)
MCHC: 32.8 g/dL (ref 30.0–36.0)
MCV: 91.8 fL (ref 80.0–100.0)
Monocytes Absolute: 0.7 10*3/uL (ref 0.1–1.0)
Monocytes Relative: 9 %
Neutro Abs: 3.8 10*3/uL (ref 1.7–7.7)
Neutrophils Relative %: 52 %
Platelets: 291 10*3/uL (ref 150–400)
RBC: 4.25 MIL/uL (ref 4.22–5.81)
RDW: 13.2 % (ref 11.5–15.5)
WBC: 7.4 10*3/uL (ref 4.0–10.5)
nRBC: 0 % (ref 0.0–0.2)

## 2023-01-11 LAB — BASIC METABOLIC PANEL
Anion gap: 5 (ref 5–15)
BUN: 14 mg/dL (ref 6–20)
CO2: 24 mmol/L (ref 22–32)
Calcium: 8.3 mg/dL — ABNORMAL LOW (ref 8.9–10.3)
Chloride: 108 mmol/L (ref 98–111)
Creatinine, Ser: 1.23 mg/dL (ref 0.61–1.24)
GFR, Estimated: >60 mL/min (ref >60)
Glucose, Bld: 129 mg/dL — ABNORMAL HIGH (ref 70–99)
Potassium: 4.2 mmol/L (ref 3.5–5.1)
Sodium: 137 mmol/L (ref 135–145)

## 2023-01-11 MED ORDER — IOHEXOL 350 MG/ML SOLN
75.0000 mL | Freq: Once | INTRAVENOUS | Status: AC | PRN
  Administered 2023-01-11: 75 mL via INTRAVENOUS

## 2023-01-11 MED ORDER — SODIUM CHLORIDE 0.9 % IV SOLN
2.0000 g | Freq: Every day | INTRAVENOUS | Status: DC
  Administered 2023-01-11 – 2023-01-12 (×2): 2 g via INTRAVENOUS
  Filled 2023-01-11 (×3): qty 20

## 2023-01-11 NOTE — Plan of Care (Addendum)
Problem: Health Behavior/Discharge Planning: Goal: Ability to manage health-related needs will improve Outcome: Progressing  Pt admitted into the hospital for cellulitis of LLE.  He is on IV abx per MD's orders.  Pt had an I/D on 01/09/2023 of LLE.         Problem: Clinical Measurements: Goal: Will remain free from infection Outcome: Progressing S/Sx of infection monitored and assessed q-shift.  Pt has remained afebrile thus far.  He is on IV abx per MD's orders.  Pt had an I/D on 01/09/2023 of LLE.       Problem: Clinical Measurements: Goal: Respiratory complications will improve Outcome: Progressing Respiratory status monitored and assessed q-shift.  Pt is on room air with PO2 at 97-98 and respiration rate of 16 breaths per minute.  Pt has not endorsed c/o SOB or DOE.    Problem: Activity: Goal: Risk for activity intolerance will decrease Outcome: Progressing Pt is independent of all his ADLs.  He was observed OOB ambulating in his room with a steady gait.   Problem: Nutrition: Goal: Adequate nutrition will be maintained Outcome: Progressing Pt is on a regular diet per MD's orders.  He has been able to tolerate his diet w/o s/sx of n/v or abdominal pain/ distention.    Problem: Coping: Goal: Level of anxiety will decrease Outcome: Progressing Pt has not endorsed c/o anxiety thus far.       Problem: Clinical Measurements: Goal: Cardiovascular complication will be avoided Outcome: Progressing Pt VS WNL thus far.    Problem: Elimination: Goal: Will not experience complications related to bowel motility Outcome: Progressing Pt is continent of both bowel and bladder.  He has not endorsed c/o constipation, dysuria or abdominal pain/ distention.          Problem: Pain Managment: Goal: General experience of comfort will improve Outcome: Progressing Pt has denied pain thus far.   Problem: Safety: Goal: Ability to remain free from injury will improve Outcome: Progressing Pt has  remained free from falls thus far.  Instructed pt to utilize RN call light for assistance.  Hourly rounds performed.  Bed locked, in lowest position, with two upper side rails engaged.  Belongings and call light within reach.  Pt was observed OOB ambulating in his room with a steady gait.  Nonskid sock implemented.   Problem: Skin Integrity: Goal: Risk for impaired skin integrity will decrease Outcome: Progressing Skin integrity monitored and assessed q-shift.  Instructed pt to turn q2 hours to prevent further skin impairment.  Tubes and drains assessed for device related pressure sores.  Pt is continent of both bowel and bladder.  Dressing changes performed per MD's orders.

## 2023-01-11 NOTE — Progress Notes (Signed)
PROGRESS NOTE  Luis Johns WNU:[id number] DOB: 08/15/1970 DOA: 01/09/2023 PCP: Tresa Garter, MD  HPI/Recap of past 24 hours: Luis Johns is a 52 y.o. male with limited medical history, who presents with concern for infection involving his left leg. Reports approximately 1 week ago he fell out of bed and hit his left leg on his bedside dresser. Initially no issues, and just had a scratch over the lower shin. Over the next few days had significant redness, swelling and pain which progressed. No fever or chills. In the ED, pt had an I&D, with 20cc of purulent material drained, sent out for culture. Wound was packed and patient admitted for further management.     Today, patient denies any new complaints.  Left lower extremity with minimal improvement   Assessment/Plan: Principal Problem:   Cellulitis and abscess of left leg Active Problems:   Hypokalemia   Purulent cellulitis, with abscess, left lower extremity Currently afebrile, with no leukocytosis BC x 2 NGTD S/p I&D by EDP, culture growing staph aureus  Duplex LLE negative for DVT A1c pending Discussed with Dr. Hulda Humphrey with [facility], on 9/15, rec repeat CT LLE with contrast for further eval Continue IV vancomycin, ceftriaxone for now due to extent of cellulitis   Obesity Lifestyle modification advised     Estimated body mass index is 35.14 kg/m as calculated from the following:   Height as of this encounter: 5\' 10"  (1.778 m).   Weight as of this encounter: 111.1 kg.     Code Status: Full  Family Communication: None at bedside  Disposition Plan: Status is: Inpatient The patient will require care spanning > 2 midnights and should be moved to inpatient because: Level of care      Consultants: None  Procedures: I & D on 9/13 by EDP  Antimicrobials: Vancomycin Ceftriaxone  DVT prophylaxis:  Lovenox   Objective: Vitals:   01/10/23 1600 01/10/23 2053 01/11/23 0458 01/11/23 0732  BP:  122/64 122/77 115/68 126/65  Pulse: 68 73 64 64  Resp: 16 18 18 16   Temp: 98.5 F (36.9 C) 99.7 F (37.6 C) 98.5 F (36.9 C) 98.8 F (37.1 C)  TempSrc: Oral Oral Oral Oral  SpO2: 98% 98% 100% 98%  Weight:      Height:        Intake/Output Summary (Last 24 hours) at 01/11/2023 1526 Last data filed at 01/11/2023 1020 Gross per 24 hour  Intake 740 ml  Output --  Net 740 ml   Filed Weights   01/09/23 1706  Weight: 111.1 kg    Exam: General: NAD  Cardiovascular: S1, S2 present Respiratory: CTAB Abdomen: Soft, nontender, nondistended, bowel sounds present Musculoskeletal: Noted marked pitting edema with erythema and pain noted on L lower shin Skin: As noted above Psychiatry: Normal mood     Data Reviewed: CBC: Recent Labs  Lab 01/09/23 1715 01/10/23 0222 01/11/23 0630  WBC 8.6 9.2 7.4  NEUTROABS 5.4  --  3.8  HGB 13.3 13.0 12.8*  HCT 41.4 40.1 39.0  MCV 91.4 92.8 91.8  PLT 299 282 291   Basic Metabolic Panel: Recent Labs  Lab 01/09/23 1715 01/11/23 0630  NA 136 137  K 3.4* 4.2  CL 102 108  CO2 24 24  GLUCOSE 98 129*  BUN 12 14  CREATININE 1.10 1.23  CALCIUM 8.6* 8.3*   GFR: Estimated Creatinine Clearance: 87.6 mL/min (by C-G formula based on SCr of 1.23 mg/dL). Liver Function Tests: No results for input(s): "  AST", "ALT", "ALKPHOS", "BILITOT", "PROT", "ALBUMIN" in the last 168 hours. No results for input(s): "LIPASE", "AMYLASE" in the last 168 hours. No results for input(s): "AMMONIA" in the last 168 hours. Coagulation Profile: No results for input(s): "INR", "PROTIME" in the last 168 hours. Cardiac Enzymes: No results for input(s): "CKTOTAL", "CKMB", "CKMBINDEX", "TROPONINI" in the last 168 hours. BNP (last 3 results) No results for input(s): "PROBNP" in the last 8760 hours. HbA1C: No results for input(s): "HGBA1C" in the last 72 hours. CBG: No results for input(s): "GLUCAP" in the last 168 hours. Lipid Profile: No results for input(s):  "CHOL", "HDL", "LDLCALC", "TRIG", "CHOLHDL", "LDLDIRECT" in the last 72 hours. Thyroid Function Tests: No results for input(s): "TSH", "T4TOTAL", "FREET4", "T3FREE", "THYROIDAB" in the last 72 hours. Anemia Panel: No results for input(s): "VITAMINB12", "FOLATE", "FERRITIN", "TIBC", "IRON", "RETICCTPCT" in the last 72 hours. Urine analysis:    Component Value Date/Time   COLORURINE YELLOW 01/25/2015 1027   APPEARANCEUR CLEAR 01/25/2015 1027   LABSPEC 1.020 01/25/2015 1027   PHURINE 7.0 01/25/2015 1027   GLUCOSEU NEGATIVE 01/25/2015 1027   HGBUR MODERATE (A) 01/25/2015 1027   BILIRUBINUR NEGATIVE 01/25/2015 1027   KETONESUR NEGATIVE 01/25/2015 1027   UROBILINOGEN 0.2 01/25/2015 1027   NITRITE NEGATIVE 01/25/2015 1027   LEUKOCYTESUR NEGATIVE 01/25/2015 1027   Sepsis Labs: @LABRCNTIP (procalcitonin:4,lacticidven:4)  ) Recent Results (from the past 240 hour(s))  Culture, blood (single)     Status: None (Preliminary result)   Collection Time: 01/09/23 11:06 PM   Specimen: BLOOD  Result Value Ref Range Status   Specimen Description BLOOD LEFT ANTECUBITAL  Final   Special Requests   Final    BOTTLES DRAWN AEROBIC AND ANAEROBIC Blood Culture results may not be optimal due to an excessive volume of blood received in culture bottles   Culture   Final    NO GROWTH 2 DAYS Performed at [facility], [address], [place], [place] [address]    Report Status PENDING  Incomplete  Body fluid culture w Gram Stain     Status: Abnormal (Preliminary result)   Collection Time: 01/10/23  1:04 AM   Specimen: Abscess; Body Fluid  Result Value Ref Range Status   Specimen Description ABSCESS  Final   Special Requests LEFT LOWER LEG  Final   Gram Stain   Final    ABUNDANT WBC PRESENT,BOTH PMN AND MONONUCLEAR FEW GRAM POSITIVE COCCI IN CLUSTERS    Culture (A)  Final    STAPHYLOCOCCUS AUREUS SUSCEPTIBILITIES TO FOLLOW Performed at [facility], [address], [place], [place]  [address]    Report Status PENDING  Incomplete      Studies: No results found.  Scheduled Meds:  enoxaparin (LOVENOX) injection  55 mg Subcutaneous Q24H    Continuous Infusions:  cefTRIAXone (ROCEPHIN)  IV Stopped (01/11/23 1150)   vancomycin Stopped (01/11/23 1230)     LOS: 0 days     Briant Cedar, MD [facility]  If 7PM-7AM, please contact night-coverage [email or web address] 01/11/2023, 3:26 PM

## 2023-01-12 DIAGNOSIS — L02416 Cutaneous abscess of left lower limb: Secondary | ICD-10-CM | POA: Diagnosis not present

## 2023-01-12 DIAGNOSIS — L03116 Cellulitis of left lower limb: Secondary | ICD-10-CM | POA: Diagnosis not present

## 2023-01-12 LAB — CBC WITH DIFFERENTIAL/PLATELET
Abs Immature Granulocytes: 0.05 10*3/uL (ref 0.00–0.07)
Basophils Absolute: 0 10*3/uL (ref 0.0–0.1)
Basophils Relative: 0 %
Eosinophils Absolute: 0.6 10*3/uL — ABNORMAL HIGH (ref 0.0–0.5)
Eosinophils Relative: 7 %
HCT: 38.9 % — ABNORMAL LOW (ref 39.0–52.0)
Hemoglobin: 13.1 g/dL (ref 13.0–17.0)
Immature Granulocytes: 1 %
Lymphocytes Relative: 25 %
Lymphs Abs: 2 10*3/uL (ref 0.7–4.0)
MCH: 31 pg (ref 26.0–34.0)
MCHC: 33.7 g/dL (ref 30.0–36.0)
MCV: 92 fL (ref 80.0–100.0)
Monocytes Absolute: 0.7 10*3/uL (ref 0.1–1.0)
Monocytes Relative: 9 %
Neutro Abs: 4.7 10*3/uL (ref 1.7–7.7)
Neutrophils Relative %: 58 %
Platelets: 327 10*3/uL (ref 150–400)
RBC: 4.23 MIL/uL (ref 4.22–5.81)
RDW: 13.2 % (ref 11.5–15.5)
WBC: 8.1 10*3/uL (ref 4.0–10.5)
nRBC: 0 % (ref 0.0–0.2)

## 2023-01-12 LAB — HEMOGLOBIN A1C
Hgb A1c MFr Bld: 6.9 % — ABNORMAL HIGH (ref 4.8–5.6)
Mean Plasma Glucose: 151 mg/dL

## 2023-01-12 LAB — BODY FLUID CULTURE W GRAM STAIN

## 2023-01-12 LAB — BASIC METABOLIC PANEL
Anion gap: 7 (ref 5–15)
BUN: 9 mg/dL (ref 6–20)
CO2: 26 mmol/L (ref 22–32)
Calcium: 8.7 mg/dL — ABNORMAL LOW (ref 8.9–10.3)
Chloride: 104 mmol/L (ref 98–111)
Creatinine, Ser: 1.19 mg/dL (ref 0.61–1.24)
GFR, Estimated: >60 mL/min (ref >60)
Glucose, Bld: 126 mg/dL — ABNORMAL HIGH (ref 70–99)
Potassium: 4.1 mmol/L (ref 3.5–5.1)
Sodium: 137 mmol/L (ref 135–145)

## 2023-01-12 MED ORDER — CEFAZOLIN SODIUM-DEXTROSE 2-4 GM/100ML-% IV SOLN
2.0000 g | Freq: Three times a day (TID) | INTRAVENOUS | Status: DC
  Administered 2023-01-13 – 2023-01-16 (×10): 2 g via INTRAVENOUS
  Filled 2023-01-12 (×10): qty 100

## 2023-01-12 MED ORDER — INSULIN ASPART 100 UNIT/ML IJ SOLN
0.0000 [IU] | Freq: Three times a day (TID) | INTRAMUSCULAR | Status: DC
  Administered 2023-01-13: 2 [IU] via SUBCUTANEOUS
  Administered 2023-01-14 – 2023-01-16 (×4): 1 [IU] via SUBCUTANEOUS

## 2023-01-12 MED ORDER — INSULIN ASPART 100 UNIT/ML IJ SOLN: 0.0000 [IU] | Freq: Every day | INTRAMUSCULAR | Status: DC

## 2023-01-12 NOTE — Progress Notes (Signed)
   01/12/23 1503  TOC Brief Assessment  Insurance and Status Reviewed [facility]/[employer] PPO)  Patient has primary care physician Yes (Plotnikov, Georgina Quint, MD)  Home environment has been reviewed From Home  Prior level of function: indpendent  Prior/Current Home Services No current home services  Social Determinants of Health Reivew SDOH reviewed no interventions necessary  Readmission risk has been reviewed Yes (6 %)  Transition of care needs transition of care needs identified, TOC will continue to follow (Will need to follow for possible Wound CAre needs at DC)

## 2023-01-12 NOTE — Plan of Care (Signed)
Problem: Health Behavior/Discharge Planning: Goal: Ability to manage health-related needs will improve Outcome: Progressing  Pt admitted into the hospital for cellulitis of LLE.  He is on IV abx per MD's orders.  Pt had an I/D on 01/09/2023 of LLE.         Problem: Clinical Measurements: Goal: Will remain free from infection Outcome: Progressing S/Sx of infection monitored and assessed q-shift.  Pt has remained afebrile thus far.  He is on IV abx per MD's orders.  Pt had an I/D on 01/09/2023 of LLE.       Problem: Clinical Measurements: Goal: Respiratory complications will improve Outcome: Progressing Respiratory status monitored and assessed q-shift.  Pt is on room air with PO2 at 97-98 and respiration rate of 16 breaths per minute.  Pt has not endorsed c/o SOB or DOE.    Problem: Activity: Goal: Risk for activity intolerance will decrease Outcome: Progressing Pt is independent of all his ADLs.  He was observed OOB ambulating in his room with a steady gait.   Problem: Nutrition: Goal: Adequate nutrition will be maintained Outcome: Progressing Pt is on a regular diet per MD's orders.  He has been able to tolerate his diet w/o s/sx of n/v or abdominal pain/ distention.    Problem: Coping: Goal: Level of anxiety will decrease Outcome: Progressing Pt has not endorsed c/o anxiety thus far.       Problem: Clinical Measurements: Goal: Cardiovascular complication will be avoided Outcome: Progressing Pt VS WNL thus far.    Problem: Elimination: Goal: Will not experience complications related to bowel motility Outcome: Progressing Pt is continent of both bowel and bladder.  He has not endorsed c/o constipation, dysuria or abdominal pain/ distention.          Problem: Pain Managment: Goal: General experience of comfort will improve Outcome: Progressing Pt has denied pain thus far.   Problem: Safety: Goal: Ability to remain free from injury will improve Outcome: Progressing Pt has  remained free from falls thus far.  Instructed pt to utilize RN call light for assistance.  Hourly rounds performed.  Bed locked, in lowest position, with two upper side rails engaged.  Belongings and call light within reach.  Pt was observed OOB ambulating in his room with a steady gait.  Nonskid sock implemented.   Problem: Skin Integrity: Goal: Risk for impaired skin integrity will decrease Outcome: Progressing Skin integrity monitored and assessed q-shift.  Instructed pt to turn q2 hours to prevent further skin impairment.  Tubes and drains assessed for device related pressure sores.  Pt is continent of both bowel and bladder.  Dressing changes performed per MD's orders.

## 2023-01-12 NOTE — Progress Notes (Signed)
PROGRESS NOTE  Luis Johns MWU:[id number] DOB: [birth year] DOA: 01/09/2023 PCP: Tresa Garter, MD  HPI/Recap of past 24 hours: Luis Johns is a 52 y.o. male with limited medical history, who presents with concern for infection involving his left leg. Reports approximately 1 week ago he fell out of bed and hit his left leg on his bedside dresser. Initially no issues, and just had a scratch over the lower shin. Over the next few days had significant redness, swelling and pain which progressed. No fever or chills. In the ED, pt had an I&D, with 20cc of purulent material drained, sent out for culture. Wound was packed and patient admitted for further management.     Today, pt denies any new complaints.   Assessment/Plan: Principal Problem:   Cellulitis and abscess of left leg Active Problems:   Hypokalemia   Purulent cellulitis, with abscess, left lower extremity Currently afebrile, with no leukocytosis BC x 2 NGTD S/p I&D by EDP, culture growing MSSA Duplex LLE negative for DVT CT LLE showed possible 5 x 5 fluid collection within the medial soft tissues of the lower leg Orthopedics consulted, appreciate recs S/p IV vancomycin, ceftriaxone--> IV ancef  Diabetes mellitus type 2 Possibly new diagnosis  Last A1c 6.9 Accuchecks, diabetic coordinator   Obesity Lifestyle modification advised     Estimated body mass index is 35.14 kg/m as calculated from the following:   Height as of this encounter: 5\' 10"  (1.778 m).   Weight as of this encounter: 111.1 kg.     Code Status: Full  Family Communication: None at bedside  Disposition Plan: Status is: Inpatient The patient will require care spanning > 2 midnights and should be moved to inpatient because: Level of care      Consultants: Orthopedics  Procedures: I & D on 9/13 by EDP  Antimicrobials: Ancef  DVT prophylaxis:  Lovenox   Objective: Vitals:   01/12/23 0515 01/12/23 0736 01/12/23 1535  01/12/23 1730  BP: 122/72 126/65 122/64 133/85  Pulse: (!) 59 64 65 63  Resp: 18 16 16 18   Temp: 98.6 F (37 C) 98.2 F (36.8 C) 98.1 F (36.7 C) 98.8 F (37.1 C)  TempSrc: Oral Oral Oral Oral  SpO2: 94% 96% 98% 99%  Weight:      Height:        Intake/Output Summary (Last 24 hours) at 01/12/2023 1742 Last data filed at 01/12/2023 0958 Gross per 24 hour  Intake 740 ml  Output --  Net 740 ml   Filed Weights   01/09/23 1706  Weight: 111.1 kg    Exam: General: NAD  Cardiovascular: S1, S2 present Respiratory: CTAB Abdomen: Soft, nontender, nondistended, bowel sounds present Musculoskeletal: Noted marked pitting edema with erythema and pain noted on L lower shin Skin: As noted above Psychiatry: Normal mood     Data Reviewed: CBC: Recent Labs  Lab 01/09/23 1715 01/10/23 0222 01/11/23 0630 01/12/23 0851  WBC 8.6 9.2 7.4 8.1  NEUTROABS 5.4  --  3.8 4.7  HGB 13.3 13.0 12.8* 13.1  HCT 41.4 40.1 39.0 38.9*  MCV 91.4 92.8 91.8 92.0  PLT 299 282 291 327   Basic Metabolic Panel: Recent Labs  Lab 01/09/23 1715 01/11/23 0630 01/12/23 0851  NA 136 137 137  K 3.4* 4.2 4.1  CL 102 108 104  CO2 24 24 26   GLUCOSE 98 129* 126*  BUN 12 14 9   CREATININE 1.10 1.23 1.19  CALCIUM 8.6* 8.3* 8.7*   GFR:  Estimated Creatinine Clearance: 90.6 mL/min (by C-G formula based on SCr of 1.19 mg/dL). Liver Function Tests: No results for input(s): "AST", "ALT", "ALKPHOS", "BILITOT", "PROT", "ALBUMIN" in the last 168 hours. No results for input(s): "LIPASE", "AMYLASE" in the last 168 hours. No results for input(s): "AMMONIA" in the last 168 hours. Coagulation Profile: No results for input(s): "INR", "PROTIME" in the last 168 hours. Cardiac Enzymes: No results for input(s): "CKTOTAL", "CKMB", "CKMBINDEX", "TROPONINI" in the last 168 hours. BNP (last 3 results) No results for input(s): "PROBNP" in the last 8760 hours. HbA1C: Recent Labs    01/11/23 0630  HGBA1C 6.9*   CBG: No  results for input(s): "GLUCAP" in the last 168 hours. Lipid Profile: No results for input(s): "CHOL", "HDL", "LDLCALC", "TRIG", "CHOLHDL", "LDLDIRECT" in the last 72 hours. Thyroid Function Tests: No results for input(s): "TSH", "T4TOTAL", "FREET4", "T3FREE", "THYROIDAB" in the last 72 hours. Anemia Panel: No results for input(s): "VITAMINB12", "FOLATE", "FERRITIN", "TIBC", "IRON", "RETICCTPCT" in the last 72 hours. Urine analysis:    Component Value Date/Time   COLORURINE YELLOW 01/25/2015 1027   APPEARANCEUR CLEAR 01/25/2015 1027   LABSPEC 1.020 01/25/2015 1027   PHURINE 7.0 01/25/2015 1027   GLUCOSEU NEGATIVE 01/25/2015 1027   HGBUR MODERATE (A) 01/25/2015 1027   BILIRUBINUR NEGATIVE 01/25/2015 1027   KETONESUR NEGATIVE 01/25/2015 1027   UROBILINOGEN 0.2 01/25/2015 1027   NITRITE NEGATIVE 01/25/2015 1027   LEUKOCYTESUR NEGATIVE 01/25/2015 1027   Sepsis Labs: @LABRCNTIP (procalcitonin:4,lacticidven:4)  ) Recent Results (from the past 240 hour(s))  Culture, blood (single)     Status: None (Preliminary result)   Collection Time: 01/09/23 11:06 PM   Specimen: BLOOD  Result Value Ref Range Status   Specimen Description BLOOD LEFT ANTECUBITAL  Final   Special Requests   Final    BOTTLES DRAWN AEROBIC AND ANAEROBIC Blood Culture results may not be optimal due to an excessive volume of blood received in culture bottles   Culture   Final    NO GROWTH 3 DAYS Performed at [facility], [address], [place], [place] [address]    Report Status PENDING  Incomplete  Body fluid culture w Gram Stain     Status: Abnormal   Collection Time: 01/10/23  1:04 AM   Specimen: Abscess; Body Fluid  Result Value Ref Range Status   Specimen Description ABSCESS  Final   Special Requests LEFT LOWER LEG  Final   Gram Stain   Final    ABUNDANT WBC PRESENT,BOTH PMN AND MONONUCLEAR FEW GRAM POSITIVE COCCI IN CLUSTERS Performed at [facility], [address] [place], [place]  [address]    Culture STAPHYLOCOCCUS AUREUS (A)  Final   Report Status 01/12/2023 FINAL  Final   Organism ID, Bacteria STAPHYLOCOCCUS AUREUS  Final      Susceptibility   Staphylococcus aureus - MIC*    CIPROFLOXACIN <=0.5 SENSITIVE Sensitive     ERYTHROMYCIN <=0.25 SENSITIVE Sensitive     GENTAMICIN <=0.5 SENSITIVE Sensitive     OXACILLIN <=0.25 SENSITIVE Sensitive     TETRACYCLINE <=1 SENSITIVE Sensitive     VANCOMYCIN 1 SENSITIVE Sensitive     TRIMETH/SULFA <=10 SENSITIVE Sensitive     CLINDAMYCIN <=0.25 SENSITIVE Sensitive     RIFAMPIN <=0.5 SENSITIVE Sensitive     Inducible Clindamycin NEGATIVE Sensitive     LINEZOLID 2 SENSITIVE Sensitive     * STAPHYLOCOCCUS AUREUS      Studies: No results found.  Scheduled Meds:  enoxaparin (LOVENOX) injection  55  mg Subcutaneous Q24H    Continuous Infusions:  [START ON 01/13/2023]  ceFAZolin (ANCEF) IV       LOS: 1 day     Briant Cedar, MD [facility]  If 7PM-7AM, please contact night-coverage [email or web address] 01/12/2023, 5:42 PM

## 2023-01-12 NOTE — Consult Note (Signed)
Reason for Consult:Left leg abscess Referring Physician: Andreas Newport Time called: 1359 Time at bedside: 1440   Luis Johns is an 52 y.o. male.  HPI: Luis Johns fell out of bed about a week ago and struck the inside of his lower leg on the corner of a nightstand. He developed a large bruise there but it did not break the skin. The area became red, swollen, and painful over the next few days. He came to the ED where he underwent I&D of an abscess and was admitted. Since then he's improved but continues to drain purulent material. CT scan showed residual abscess and orthopedic surgery was consulted. 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  History reviewed. No pertinent past medical history.  History reviewed. No pertinent surgical history.  Family History  Problem Relation Age of Onset   Stroke Brother    Heart disease Brother    Healthy Mother     Social History:  reports that he has never smoked. He has never used smokeless tobacco. He reports current alcohol use. He reports that he does not use drugs.  Allergies: No Known Allergies  Medications: I have reviewed the patient's current medications.  Results for orders placed or performed during the hospital encounter of 01/09/23 (from the past 48 hour(s))  CBC with Differential/Platelet     Status: Abnormal   Collection Time: 01/11/23  6:30 AM  Result Value Ref Range   WBC 7.4 4.0 - 10.5 K/uL   RBC 4.25 4.22 - 5.81 MIL/uL   Hemoglobin 12.8 (L) 13.0 - 17.0 g/dL   HCT 16.1 09.6 - 04.5 %   MCV 91.8 80.0 - 100.0 fL   MCH 30.1 26.0 - 34.0 pg   MCHC 32.8 30.0 - 36.0 g/dL   RDW 40.9 81.1 - 91.4 %   Platelets 291 150 - 400 K/uL   nRBC 0.0 0.0 - 0.2 %   Neutrophils Relative % 52 %   Neutro Abs 3.8 1.7 - 7.7 K/uL   Lymphocytes Relative 30 %   Lymphs Abs 2.2 0.7 - 4.0 K/uL   Monocytes Relative 9 %   Monocytes Absolute 0.7 0.1 - 1.0 K/uL   Eosinophils Relative 9 %   Eosinophils Absolute 0.6 (H) 0.0 - 0.5 K/uL    Basophils Relative 0 %   Basophils Absolute 0.0 0.0 - 0.1 K/uL   Immature Granulocytes 0 %   Abs Immature Granulocytes 0.03 0.00 - 0.07 K/uL    Comment: Performed at [facility], [address], [place], [place] [address]  Basic metabolic panel     Status: Abnormal   Collection Time: 01/11/23  6:30 AM  Result Value Ref Range   Sodium 137 135 - 145 mmol/L   Potassium 4.2 3.5 - 5.1 mmol/L   Chloride 108 98 - 111 mmol/L   CO2 24 22 - 32 mmol/L   Glucose, Bld 129 (H) 70 - 99 mg/dL    Comment: Glucose reference range applies only to samples taken after fasting for at least 8 hours.   BUN 14 6 - 20 mg/dL   Creatinine, Ser 6.21 0.61 - 1.24 mg/dL   Calcium 8.3 (L) 8.9 - 10.3 mg/dL   GFR, Estimated >30 >86 mL/min    Comment: (NOTE) Calculated using the CKD-EPI Creatinine Equation (2021)    Anion gap 5 5 - 15    Comment: Performed at [facility], [address] [place], [place] [address]  Hemoglobin A1c  Status: Abnormal   Collection Time: 01/11/23  6:30 AM  Result Value Ref Range   Hgb A1c MFr Bld 6.9 (H) 4.8 - 5.6 %    Comment: (NOTE)         Prediabetes: 5.7 - 6.4         Diabetes: >6.4         Glycemic control for adults with diabetes: <7.0    Mean Plasma Glucose 151 mg/dL    Comment: (NOTE) Performed At: [facility] [address] [place], [place] [id number] Jolene Schimke MD WU:[id number]   CBC with Differential/Platelet     Status: Abnormal   Collection Time: 01/12/23  8:51 AM  Result Value Ref Range   WBC 8.1 4.0 - 10.5 K/uL   RBC 4.23 4.22 - 5.81 MIL/uL   Hemoglobin 13.1 13.0 - 17.0 g/dL   HCT 95.6 (L) 21.3 - 08.6 %   MCV 92.0 80.0 - 100.0 fL   MCH 31.0 26.0 - 34.0 pg   MCHC 33.7 30.0 - 36.0 g/dL   RDW 57.8 46.9 - 62.9 %   Platelets 327 150 - 400 K/uL   nRBC 0.0 0.0 - 0.2 %   Neutrophils Relative % 58 %   Neutro Abs 4.7 1.7 - 7.7 K/uL   Lymphocytes Relative 25 %   Lymphs Abs 2.0 0.7 - 4.0 K/uL   Monocytes Relative 9 %   Monocytes  Absolute 0.7 0.1 - 1.0 K/uL   Eosinophils Relative 7 %   Eosinophils Absolute 0.6 (H) 0.0 - 0.5 K/uL   Basophils Relative 0 %   Basophils Absolute 0.0 0.0 - 0.1 K/uL   Immature Granulocytes 1 %   Abs Immature Granulocytes 0.05 0.00 - 0.07 K/uL    Comment: Performed at [facility], [address] [place], [place] [address]  Basic metabolic panel     Status: Abnormal   Collection Time: 01/12/23  8:51 AM  Result Value Ref Range   Sodium 137 135 - 145 mmol/L   Potassium 4.1 3.5 - 5.1 mmol/L   Chloride 104 98 - 111 mmol/L   CO2 26 22 - 32 mmol/L   Glucose, Bld 126 (H) 70 - 99 mg/dL    Comment: Glucose reference range applies only to samples taken after fasting for at least 8 hours.   BUN 9 6 - 20 mg/dL   Creatinine, Ser 3.24 0.61 - 1.24 mg/dL   Calcium 8.7 (L) 8.9 - 10.3 mg/dL   GFR, Estimated >40 >10 mL/min    Comment: (NOTE) Calculated using the CKD-EPI Creatinine Equation (2021)    Anion gap 7 5 - 15    Comment: Performed at [facility], [address], [place], [place] [address]    CT EXTREMITY LOWER LEFT W CONTRAST  Result Date: 01/12/2023 CLINICAL DATA:  Left lower leg infection with persistent swelling and erythema. History of incision and drainage EXAM: CT OF THE LOWER LEFT EXTREMITY WITH CONTRAST TECHNIQUE: Multidetector CT imaging of the lower left extremity was performed according to the standard protocol following intravenous contrast administration. RADIATION DOSE REDUCTION: This exam was performed according to the departmental dose-optimization program which includes automated exposure control, adjustment of the mA and/or kV according to patient size and/or use of iterative reconstruction technique. CONTRAST:  75mL OMNIPAQUE IOHEXOL 350 MG/ML SOLN COMPARISON:  X-ray 01/09/2023 FINDINGS: Bones/Joint/Cartilage No acute fracture. No dislocation. No erosion or periosteal elevation. No tibiotalar or subtalar joint effusion. No lytic or sclerotic bone lesion. Ligaments  Suboptimally  assessed by CT. Muscles and Tendons Musculotendinous structures within normal limits by CT. No intramuscular fluid collection. No tenosynovial fluid collections. Soft tissues Partially rim enhancing fluid collection within the medial subcutaneous soft tissues of the lower leg at the level of the distal tibial diaphysis measuring approximately 5.0 x 1.0 x 5.0 cm (series 5, image 46). Collection appears to extend to the overlying skin surface which may be related to site of previous incision or reflect a draining sinus tract. There is overlying bandaging material at this location. There are a few foci of air within the collection. Generalized subcutaneous edema. No additional organized fluid collections. IMPRESSION: 1. Partially rim-enhancing fluid collection within the medial subcutaneous soft tissues of the lower leg at the level of the distal tibial diaphysis measuring approximately 5.0 x 1.0 x 5.0 cm. Collection appears to extend to the overlying skin surface which may be related to site of previous incision or reflect a draining sinus tract. There are a few foci of air within the collection. 2. Generalized subcutaneous edema. 3. No acute osseous abnormality. Electronically Signed   By: Duanne Guess D.O.   On: 01/12/2023 11:40   VAS [place] LOWER EXTREMITY VENOUS (DVT)  Result Date: 01/10/2023  Lower Venous DVT Study Patient Name:  COWEN JEWART  Date of Exam:   01/10/2023 Medical Rec #: [id number]         Accession #:    [id number] Date of Birth: Apr 30, 1970         Patient Gender: M Patient Age:   47 years Exam Location:  [facility] Procedure:      VAS [place] LOWER EXTREMITY VENOUS (DVT) Referring Phys: Dolly Rias --------------------------------------------------------------------------------  Indications: Pain, Swelling, Erythema, and Abscess of left calf, status post I&D.  Comparison Study: No prior study on file Performing Technologist: Sherren Kerns RVS  Examination Guidelines: A  complete evaluation includes B-mode imaging, spectral Doppler, color Doppler, and power Doppler as needed of all accessible portions of each vessel. Bilateral testing is considered an integral part of a complete examination. Limited examinations for reoccurring indications may be performed as noted. The reflux portion of the exam is performed with the patient in reverse Trendelenburg.  +-----+---------------+---------+-----------+----------+--------------+ RIGHTCompressibilityPhasicitySpontaneityPropertiesThrombus Aging +-----+---------------+---------+-----------+----------+--------------+ CFV  Full           Yes      Yes                                 +-----+---------------+---------+-----------+----------+--------------+   +---------+---------------+---------+-----------+----------+--------------+ LEFT     CompressibilityPhasicitySpontaneityPropertiesThrombus Aging +---------+---------------+---------+-----------+----------+--------------+ CFV      Full           Yes      Yes                                 +---------+---------------+---------+-----------+----------+--------------+ SFJ      Full                                                        +---------+---------------+---------+-----------+----------+--------------+ FV Prox  Full                                                        +---------+---------------+---------+-----------+----------+--------------+  FV Mid   Full                                                        +---------+---------------+---------+-----------+----------+--------------+ FV DistalFull                                                        +---------+---------------+---------+-----------+----------+--------------+ PFV      Full                                                        +---------+---------------+---------+-----------+----------+--------------+ POP      Full           Yes      Yes                                  +---------+---------------+---------+-----------+----------+--------------+ PTV      Full                                                        +---------+---------------+---------+-----------+----------+--------------+ PERO     Full                                                        +---------+---------------+---------+-----------+----------+--------------+     Summary: RIGHT: - No evidence of common femoral vein obstruction.   LEFT: - No evidence of deep vein thrombosis in the lower extremity. No indirect evidence of obstruction proximal to the inguinal ligament.  - No cystic structure found in the popliteal fossa. - Ultrasound characteristics of enlarged lymph nodes noted in the groin.  *See table(s) above for measurements and observations. Electronically signed by Sherald Hess MD on 01/10/2023 at 5:52:36 PM.    Final     Review of Systems  HENT:  Negative for ear discharge, ear pain, hearing loss and tinnitus.   Eyes:  Negative for photophobia and pain.  Respiratory:  Negative for cough and shortness of breath.   Cardiovascular:  Negative for chest pain.  Gastrointestinal:  Negative for abdominal pain, nausea and vomiting.  Genitourinary:  Negative for dysuria, flank pain, frequency and urgency.  Musculoskeletal:  Positive for myalgias (Right lower leg). Negative for back pain and neck pain.  Neurological:  Negative for dizziness and headaches.  Hematological:  Does not bruise/bleed easily.  Psychiatric/Behavioral:  The patient is not nervous/anxious.    Blood pressure 126/65, pulse 64, temperature 98.2 F (36.8 C), temperature source Oral, resp. rate 16, height 5\' 10"  (1.778 m), weight 111.1 kg, SpO2 96%. Physical Exam Constitutional:      General: He is not in acute distress.  Appearance: He is well-developed. He is not diaphoretic.  HENT:     Head: Normocephalic and atraumatic.  Eyes:     General: No scleral icterus.       Right eye: No  discharge.        Left eye: No discharge.     Conjunctiva/sclera: Conjunctivae normal.  Cardiovascular:     Rate and Rhythm: Normal rate and regular rhythm.  Pulmonary:     Effort: Pulmonary effort is normal. No respiratory distress.  Musculoskeletal:     Cervical back: Normal range of motion.     Comments: LLE No traumatic wounds or ecchymosis. Erythema distal medial leg.  Mild TTP, wick protruding from small I&D site, active purulent discharge, no odor, no definite fluctuance  No knee or ankle effusion  Knee stable to varus/ valgus and anterior/posterior stress  Sens DPN, SPN, TN intact  Motor EHL, ext, flex, evers 5/5  DP/PT 0, 2+ pitting edema  Skin:    General: Skin is warm and dry.  Neurological:     Mental Status: He is alert.  Psychiatric:        Mood and Affect: Mood normal.        Behavior: Behavior normal.     Assessment/Plan: Right lower leg abscess -- Dr. Lajoyce Corners to evaluate later today or in AM. Ok to change dressing (dry dressing) as needed.    Freeman Caldron, PA-C Orthopedic Surgery [phone] 01/12/2023, 2:56 PM

## 2023-01-13 DIAGNOSIS — L02416 Cutaneous abscess of left lower limb: Secondary | ICD-10-CM | POA: Diagnosis not present

## 2023-01-13 DIAGNOSIS — L03116 Cellulitis of left lower limb: Secondary | ICD-10-CM | POA: Diagnosis not present

## 2023-01-13 LAB — BASIC METABOLIC PANEL
Anion gap: 9 (ref 5–15)
BUN: 12 mg/dL (ref 6–20)
CO2: 25 mmol/L (ref 22–32)
Calcium: 8.8 mg/dL — ABNORMAL LOW (ref 8.9–10.3)
Chloride: 103 mmol/L (ref 98–111)
Creatinine, Ser: 1.24 mg/dL (ref 0.61–1.24)
GFR, Estimated: >60 mL/min (ref >60)
Glucose, Bld: 125 mg/dL — ABNORMAL HIGH (ref 70–99)
Potassium: 4.1 mmol/L (ref 3.5–5.1)
Sodium: 137 mmol/L (ref 135–145)

## 2023-01-13 LAB — CBC WITH DIFFERENTIAL/PLATELET
Abs Immature Granulocytes: 0.04 10*3/uL (ref 0.00–0.07)
Basophils Absolute: 0 10*3/uL (ref 0.0–0.1)
Basophils Relative: 0 %
Eosinophils Absolute: 0.6 10*3/uL — ABNORMAL HIGH (ref 0.0–0.5)
Eosinophils Relative: 7 %
HCT: 39.2 % (ref 39.0–52.0)
Hemoglobin: 12.7 g/dL — ABNORMAL LOW (ref 13.0–17.0)
Immature Granulocytes: 1 %
Lymphocytes Relative: 29 %
Lymphs Abs: 2.3 10*3/uL (ref 0.7–4.0)
MCH: 29.3 pg (ref 26.0–34.0)
MCHC: 32.4 g/dL (ref 30.0–36.0)
MCV: 90.5 fL (ref 80.0–100.0)
Monocytes Absolute: 0.8 10*3/uL (ref 0.1–1.0)
Monocytes Relative: 9 %
Neutro Abs: 4.5 10*3/uL (ref 1.7–7.7)
Neutrophils Relative %: 54 %
Platelets: 341 10*3/uL (ref 150–400)
RBC: 4.33 MIL/uL (ref 4.22–5.81)
RDW: 13.1 % (ref 11.5–15.5)
WBC: 8.2 10*3/uL (ref 4.0–10.5)
nRBC: 0 % (ref 0.0–0.2)

## 2023-01-13 LAB — HEMOGLOBIN A1C
Hgb A1c MFr Bld: 6.6 % — ABNORMAL HIGH (ref 4.8–5.6)
Mean Plasma Glucose: 143 mg/dL

## 2023-01-13 LAB — GLUCOSE, CAPILLARY
Glucose-Capillary: 110 mg/dL — ABNORMAL HIGH (ref 70–99)
Glucose-Capillary: 120 mg/dL — ABNORMAL HIGH (ref 70–99)
Glucose-Capillary: 174 mg/dL — ABNORMAL HIGH (ref 70–99)
Glucose-Capillary: 104 mg/dL — ABNORMAL HIGH (ref 70–99)
Glucose-Capillary: 129 mg/dL — ABNORMAL HIGH (ref 70–99)

## 2023-01-13 MED ORDER — LIVING WELL WITH DIABETES BOOK
Freq: Once | Status: AC
  Filled 2023-01-13: qty 1

## 2023-01-13 NOTE — Inpatient Diabetes Management (Addendum)
Inpatient Diabetes Program Recommendations  AACE/ADA: New Consensus Statement on Inpatient Glycemic Control (2015)  Target Ranges:  Prepandial:   less than 140 mg/dL      Peak postprandial:   less than 180 mg/dL (1-2 hours)      Critically ill patients:  140 - 180 mg/dL   Lab Results  Component Value Date   GLUCAP 120 (H) 01/13/2023   HGBA1C 6.6 (H) 01/12/2023    Latest Reference Range & Units 01/12/23 23:14 01/13/23 08:30  Glucose-Capillary 70 - 99 mg/dL 440 (H) 102 (H)  (H): Data is abnormally high  Diabetes history: New Onset DM2 A1c 6.6 Current orders for Inpatient glycemic control: Novolog 0-9 units tid, 0-5 units hs  Inpatient Diabetes Program Recommendations:   Noted new onset DM2 with A1c 6.6. Ordered Living Well With Diabetes and dietician consult regarding New onset Diabetes. On discharge, please consider adding Metformin 500 mg every day and followup with PCP. Will plan to speak with pt. Today.  14:00 Spoke with patient and friend @ bedside about new diagnosis. Discussed A1C results with them and explained what an A1C is, basic pathophysiology of DM Type 2, basic home care, basic diabetes diet nutrition principles, importance of checking CBGs and maintaining good CBG control to prevent long-term and short-term complications. Reviewed signs and symptoms of hyperglycemia and hypoglycemia and how to treat hypoglycemia at home. Also reviewed blood sugar goals at home.  RNs to provide ongoing basic DM education at bedside with this patient. Have ordered educational booklet. 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 Discussed packing healthy meals with following basic plate method. Patient verbalizes understanding and plans to start planning meals.  Thank you, Luis Johns. Luis Rouillard, RN, MSN, CDE  Diabetes Coordinator Inpatient Glycemic Control Team Team Pager [phone] (8am-5pm) 01/13/2023 11:02 AM

## 2023-01-13 NOTE — Consult Note (Signed)
ORTHOPAEDIC CONSULTATION  REQUESTING PHYSICIAN: Briant Cedar, MD  Chief Complaint: Draining hematoma left leg.  HPI: Luis Johns is a 52 y.o. male who presents with draining hematoma left leg.  Patient states he sustained blunt trauma about a week ago.  Has been having clear serosanguineous drainage.  History reviewed. No pertinent past medical history. History reviewed. No pertinent surgical history. Social History   Socioeconomic History   Marital status: Divorced    Spouse name: Not on file   Number of children: Not on file   Years of education: Not on file   Highest education level: Not on file  Occupational History   Not on file  Tobacco Use   Smoking status: Never   Smokeless tobacco: Never  Vaping Use   Vaping status: Never Used  Substance and Sexual Activity   Alcohol use: Yes    Comment: very little   Drug use: No   Sexual activity: Yes  Other Topics Concern   Not on file  Social History Narrative   Not on file   Social Determinants of Health   Financial Resource Strain: Not on file  Food Insecurity: No Food Insecurity (01/10/2023)   Hunger Vital Sign    Worried About Running Out of Food in the Last Year: Never true    Ran Out of Food in the Last Year: Never true  Transportation Needs: No Transportation Needs (01/10/2023)   PRAPARE - Administrator, Civil Service (Medical): No    Lack of Transportation (Non-Medical): No  Physical Activity: Not on file  Stress: Not on file  Social Connections: Unknown (09/08/2021)   Received from [facility], [facility]   Social Network    Social Network: Not on file   Family History  Problem Relation Age of Onset   Stroke Brother    Heart disease Brother    Healthy Mother    - negative except otherwise stated in the family history section No Known Allergies Prior to Admission medications   Medication Sig Start Date End Date Taking? Authorizing Provider  albuterol (VENTOLIN HFA)  108 (90 Base) MCG/ACT inhaler Inhale 1-2 puffs into the lungs every 6 (six) hours as needed for wheezing or shortness of breath. Patient taking differently: Inhale 2 puffs into the lungs every 6 (six) hours as needed for wheezing or shortness of breath. 07/15/20  Yes Wallis Bamberg, PA-C  naproxen sodium (ALEVE) 220 MG tablet Take 660 mg by mouth every 4 (four) hours as needed (pain).   Yes [provider]  ciclopirox (PENLAC) 8 % solution Apply daily to affected area(s) as directed Patient not taking: Reported on 01/10/2023 04/30/22      CT EXTREMITY LOWER LEFT W CONTRAST  Result Date: 01/12/2023 CLINICAL DATA:  Left lower leg infection with persistent swelling and erythema. History of incision and drainage EXAM: CT OF THE LOWER LEFT EXTREMITY WITH CONTRAST TECHNIQUE: Multidetector CT imaging of the lower left extremity was performed according to the standard protocol following intravenous contrast administration. RADIATION DOSE REDUCTION: This exam was performed according to the departmental dose-optimization program which includes automated exposure control, adjustment of the mA and/or kV according to patient size and/or use of iterative reconstruction technique. CONTRAST:  75mL OMNIPAQUE IOHEXOL 350 MG/ML SOLN COMPARISON:  X-ray 01/09/2023 FINDINGS: Bones/Joint/Cartilage No acute fracture. No dislocation. No erosion or periosteal elevation. No tibiotalar or subtalar joint effusion. No lytic or sclerotic bone lesion. Ligaments Suboptimally assessed by CT. Muscles and Tendons Musculotendinous structures within  normal limits by CT. No intramuscular fluid collection. No tenosynovial fluid collections. Soft tissues Partially rim enhancing fluid collection within the medial subcutaneous soft tissues of the lower leg at the level of the distal tibial diaphysis measuring approximately 5.0 x 1.0 x 5.0 cm (series 5, image 46). Collection appears to extend to the overlying skin surface which may be related to  site of previous incision or reflect a draining sinus tract. There is overlying bandaging material at this location. There are a few foci of air within the collection. Generalized subcutaneous edema. No additional organized fluid collections. IMPRESSION: 1. Partially rim-enhancing fluid collection within the medial subcutaneous soft tissues of the lower leg at the level of the distal tibial diaphysis measuring approximately 5.0 x 1.0 x 5.0 cm. Collection appears to extend to the overlying skin surface which may be related to site of previous incision or reflect a draining sinus tract. There are a few foci of air within the collection. 2. Generalized subcutaneous edema. 3. No acute osseous abnormality. Electronically Signed   By: Duanne Guess D.O.   On: 01/12/2023 11:40   - pertinent xrays, CT, MRI studies were reviewed and independently interpreted  Positive ROS: All other systems have been reviewed and were otherwise negative with the exception of those mentioned in the HPI and as above.  Physical Exam: General: Alert, no acute distress Psychiatric: Patient is competent for consent with normal mood and affect Lymphatic: No axillary or cervical lymphadenopathy Cardiovascular: No pedal edema Respiratory: No cyanosis, no use of accessory musculature GI: No organomegaly, abdomen is soft and non-tender    Images:  @ENCIMAGES @  Labs:  Lab Results  Component Value Date   HGBA1C 6.6 (H) 01/12/2023   HGBA1C 6.9 (H) 01/11/2023   REPTSTATUS 01/12/2023 FINAL 01/10/2023   GRAMSTAIN  01/10/2023    ABUNDANT WBC PRESENT,BOTH PMN AND MONONUCLEAR FEW GRAM POSITIVE COCCI IN CLUSTERS Performed at [facility], [address], [place], [place] [address]    CULT STAPHYLOCOCCUS AUREUS (A) 01/10/2023   LABORGA STAPHYLOCOCCUS AUREUS 01/10/2023    Lab Results  Component Value Date   ALBUMIN 4.1 01/25/2015   ALBUMIN 4.4 08/19/2014        Latest Ref Rng & Units 01/13/2023    5:40 AM  01/12/2023    8:51 AM 01/11/2023    6:30 AM  CBC EXTENDED  WBC 4.0 - 10.5 K/uL 8.2  8.1  7.4   RBC 4.22 - 5.81 MIL/uL 4.33  4.23  4.25   Hemoglobin 13.0 - 17.0 g/dL 40.1  02.7  25.3   HCT 39.0 - 52.0 % 39.2  38.9  39.0   Platelets 150 - 400 K/uL 341  327  291   NEUT# 1.7 - 7.7 K/uL 4.5  4.7  3.8   Lymph# 0.7 - 4.0 K/uL 2.3  2.0  2.2     Neurologic: Patient does not have protective sensation bilateral lower extremities.   MUSCULOSKELETAL:   Skin: Examination patient has venous and lymphatic swelling in both lower extremities.  There is a small draining wound over the left medial calf.  There is no purulent drainage.  The calf is swollen with mixed venous and lymphatic insufficiency.  Patient has no pain with active or passive range of motion of the ankle no clinical signs of a compartment syndrome.  Review of the body fluid from the draining wound shows Staph aureus that is pansensitive.  White cell count 8.2 with a hemoglobin of 12.7.  Hemoglobin A1c 6.6.  Assessment: Assessment: Draining fluid collection left calf from blunt trauma with cultures positive for Staph aureus.  Plan: Plan: I will continue to follow patient to see if this will resolve on its own with draining.  If insufficient resolution with the open draining wound  would need to proceed with surgery on Friday for surgical debridement.  Thank you for the consult and the opportunity to see Mr. Erenest Rasher, MD [facility] [phone] 4:31 PM

## 2023-01-13 NOTE — Progress Notes (Signed)
PROGRESS NOTE  Luis Johns DGU:[id number] DOB: May 09, 1970 DOA: 01/09/2023 PCP: Tresa Garter, MD  HPI/Recap of past 24 hours: Luis Johns is a 52 y.o. male with limited medical history, who presents with concern for infection involving his left leg. Reports approximately 1 week ago he fell out of bed and hit his left leg on his bedside dresser. Initially no issues, and just had a scratch over the lower shin. Over the next few days had significant redness, swelling and pain which progressed. No fever or chills. In the ED, pt had an I&D, with 20cc of purulent material drained, sent out for culture. Wound was packed and patient admitted for further management.     Today, patient denies any new complaints   Assessment/Plan: Principal Problem:   Cellulitis and abscess of left leg Active Problems:   Hypokalemia   Purulent cellulitis, with abscess, left lower extremity Currently afebrile, with no leukocytosis BC x 2 NGTD S/p I&D by EDP, culture growing MSSA Duplex LLE negative for DVT CT LLE showed possible 5 x 5 fluid collection within the medial soft tissues of the lower leg Orthopedics consulted, appreciate recs, Dr Lajoyce Corners on board S/p IV vancomycin, ceftriaxone--> IV ancef  Diabetes mellitus type 2 Possibly new diagnosis  Last A1c 6.9 Vs 6.6 Accuchecks, diabetic coordinator  May need metformin on d/c, with glucometer/kit  Obesity Lifestyle modification advised     Estimated body mass index is 35.14 kg/m as calculated from the following:   Height as of this encounter: 5\' 10"  (1.778 m).   Weight as of this encounter: 111.1 kg.     Code Status: Full  Family Communication: None at bedside  Disposition Plan: Status is: Inpatient The patient will require care spanning > 2 midnights and should be moved to inpatient because: Level of care      Consultants: Orthopedics  Procedures: I & D on 9/13 by EDP  Antimicrobials: Ancef  DVT prophylaxis:   Lovenox   Objective: Vitals:   01/12/23 1730 01/12/23 2010 01/13/23 0404 01/13/23 0828  BP: 133/85 127/74 128/75 118/81  Pulse: 63 68 68 62  Resp: 18 18 19    Temp: 98.8 F (37.1 C) 98.9 F (37.2 C) 98.4 F (36.9 C) 98.6 F (37 C)  TempSrc: Oral Oral Oral Oral  SpO2: 99% 96% 98% 96%  Weight:      Height:        Intake/Output Summary (Last 24 hours) at 01/13/2023 1610 Last data filed at 01/13/2023 0646 Gross per 24 hour  Intake 540 ml  Output --  Net 540 ml   Filed Weights   01/09/23 1706  Weight: 111.1 kg    Exam: General: NAD  Cardiovascular: S1, S2 present Respiratory: CTAB Abdomen: Soft, nontender, nondistended, bowel sounds present Musculoskeletal: Noted marked pitting edema with erythema on L lower shin Skin: As noted above Psychiatry: Normal mood     Data Reviewed: CBC: Recent Labs  Lab 01/09/23 1715 01/10/23 0222 01/11/23 0630 01/12/23 0851 01/13/23 0540  WBC 8.6 9.2 7.4 8.1 8.2  NEUTROABS 5.4  --  3.8 4.7 4.5  HGB 13.3 13.0 12.8* 13.1 12.7*  HCT 41.4 40.1 39.0 38.9* 39.2  MCV 91.4 92.8 91.8 92.0 90.5  PLT 299 282 291 327 341   Basic Metabolic Panel: Recent Labs  Lab 01/09/23 1715 01/11/23 0630 01/12/23 0851 01/13/23 0540  NA 136 137 137 137  K 3.4* 4.2 4.1 4.1  CL 102 108 104 103  CO2 24 24 26  25  GLUCOSE 98 129* 126* 125*  BUN 12 14 9 12   CREATININE 1.10 1.23 1.19 1.24  CALCIUM 8.6* 8.3* 8.7* 8.8*   GFR: Estimated Creatinine Clearance: 86.9 mL/min (by C-G formula based on SCr of 1.24 mg/dL). Liver Function Tests: No results for input(s): "AST", "ALT", "ALKPHOS", "BILITOT", "PROT", "ALBUMIN" in the last 168 hours. No results for input(s): "LIPASE", "AMYLASE" in the last 168 hours. No results for input(s): "AMMONIA" in the last 168 hours. Coagulation Profile: No results for input(s): "INR", "PROTIME" in the last 168 hours. Cardiac Enzymes: No results for input(s): "CKTOTAL", "CKMB", "CKMBINDEX", "TROPONINI" in the last 168  hours. BNP (last 3 results) No results for input(s): "PROBNP" in the last 8760 hours. HbA1C: Recent Labs    01/11/23 0630 01/12/23 0851  HGBA1C 6.9* 6.6*   CBG: Recent Labs  Lab 01/12/23 2314 01/13/23 0830 01/13/23 1224  GLUCAP 110* 120* 174*   Lipid Profile: No results for input(s): "CHOL", "HDL", "LDLCALC", "TRIG", "CHOLHDL", "LDLDIRECT" in the last 72 hours. Thyroid Function Tests: No results for input(s): "TSH", "T4TOTAL", "FREET4", "T3FREE", "THYROIDAB" in the last 72 hours. Anemia Panel: No results for input(s): "VITAMINB12", "FOLATE", "FERRITIN", "TIBC", "IRON", "RETICCTPCT" in the last 72 hours. Urine analysis:    Component Value Date/Time   COLORURINE YELLOW 01/25/2015 1027   APPEARANCEUR CLEAR 01/25/2015 1027   LABSPEC 1.020 01/25/2015 1027   PHURINE 7.0 01/25/2015 1027   GLUCOSEU NEGATIVE 01/25/2015 1027   HGBUR MODERATE (A) 01/25/2015 1027   BILIRUBINUR NEGATIVE 01/25/2015 1027   KETONESUR NEGATIVE 01/25/2015 1027   UROBILINOGEN 0.2 01/25/2015 1027   NITRITE NEGATIVE 01/25/2015 1027   LEUKOCYTESUR NEGATIVE 01/25/2015 1027   Sepsis Labs: @LABRCNTIP (procalcitonin:4,lacticidven:4)  ) Recent Results (from the past 240 hour(s))  Culture, blood (single)     Status: None (Preliminary result)   Collection Time: 01/09/23 11:06 PM   Specimen: BLOOD  Result Value Ref Range Status   Specimen Description BLOOD LEFT ANTECUBITAL  Final   Special Requests   Final    BOTTLES DRAWN AEROBIC AND ANAEROBIC Blood Culture results may not be optimal due to an excessive volume of blood received in culture bottles   Culture   Final    NO GROWTH 4 DAYS Performed at [facility], [address] [place], [place] [address]    Report Status PENDING  Incomplete  Body fluid culture w Gram Stain     Status: Abnormal   Collection Time: 01/10/23  1:04 AM   Specimen: Abscess; Body Fluid  Result Value Ref Range Status   Specimen Description ABSCESS  Final   Special Requests  LEFT LOWER LEG  Final   Gram Stain   Final    ABUNDANT WBC PRESENT,BOTH PMN AND MONONUCLEAR FEW GRAM POSITIVE COCCI IN CLUSTERS Performed at [facility], [address], [place], [place] [address]    Culture STAPHYLOCOCCUS AUREUS (A)  Final   Report Status 01/12/2023 FINAL  Final   Organism ID, Bacteria STAPHYLOCOCCUS AUREUS  Final      Susceptibility   Staphylococcus aureus - MIC*    CIPROFLOXACIN <=0.5 SENSITIVE Sensitive     ERYTHROMYCIN <=0.25 SENSITIVE Sensitive     GENTAMICIN <=0.5 SENSITIVE Sensitive     OXACILLIN <=0.25 SENSITIVE Sensitive     TETRACYCLINE <=1 SENSITIVE Sensitive     VANCOMYCIN 1 SENSITIVE Sensitive     TRIMETH/SULFA <=10 SENSITIVE Sensitive     CLINDAMYCIN <=0.25 SENSITIVE Sensitive     RIFAMPIN <=0.5 SENSITIVE Sensitive     Inducible  Clindamycin NEGATIVE Sensitive     LINEZOLID 2 SENSITIVE Sensitive     * STAPHYLOCOCCUS AUREUS      Studies: No results found.  Scheduled Meds:  enoxaparin (LOVENOX) injection  55 mg Subcutaneous Q24H   insulin aspart  0-5 Units Subcutaneous QHS   insulin aspart  0-9 Units Subcutaneous TID WC    Continuous Infusions:   ceFAZolin (ANCEF) IV 2 g (01/13/23 1251)     LOS: 2 days     Briant Cedar, MD [facility]  If 7PM-7AM, please contact night-coverage [email or web address] 01/13/2023, 4:10 PM

## 2023-01-14 DIAGNOSIS — L02416 Cutaneous abscess of left lower limb: Secondary | ICD-10-CM | POA: Diagnosis not present

## 2023-01-14 DIAGNOSIS — L03116 Cellulitis of left lower limb: Secondary | ICD-10-CM | POA: Diagnosis not present

## 2023-01-14 LAB — GLUCOSE, CAPILLARY
Glucose-Capillary: 122 mg/dL — ABNORMAL HIGH (ref 70–99)
Glucose-Capillary: 129 mg/dL — ABNORMAL HIGH (ref 70–99)
Glucose-Capillary: 121 mg/dL — ABNORMAL HIGH (ref 70–99)
Glucose-Capillary: 112 mg/dL — ABNORMAL HIGH (ref 70–99)

## 2023-01-14 LAB — CULTURE, BLOOD (SINGLE): Culture: NO GROWTH

## 2023-01-14 LAB — CBC WITH DIFFERENTIAL/PLATELET
Abs Immature Granulocytes: 0.04 10*3/uL (ref 0.00–0.07)
Basophils Absolute: 0 10*3/uL (ref 0.0–0.1)
Basophils Relative: 1 %
Eosinophils Absolute: 0.3 10*3/uL (ref 0.0–0.5)
Eosinophils Relative: 5 %
HCT: 40 % (ref 39.0–52.0)
Hemoglobin: 13.4 g/dL (ref 13.0–17.0)
Immature Granulocytes: 1 %
Lymphocytes Relative: 26 %
Lymphs Abs: 1.7 10*3/uL (ref 0.7–4.0)
MCH: 30.2 pg (ref 26.0–34.0)
MCHC: 33.5 g/dL (ref 30.0–36.0)
MCV: 90.3 fL (ref 80.0–100.0)
Monocytes Absolute: 0.5 10*3/uL (ref 0.1–1.0)
Monocytes Relative: 8 %
Neutro Abs: 4 10*3/uL (ref 1.7–7.7)
Neutrophils Relative %: 59 %
Platelets: 360 10*3/uL (ref 150–400)
RBC: 4.43 MIL/uL (ref 4.22–5.81)
RDW: 13.1 % (ref 11.5–15.5)
WBC: 6.6 10*3/uL (ref 4.0–10.5)
nRBC: 0 % (ref 0.0–0.2)

## 2023-01-14 MED ORDER — PROSOURCE PLUS PO LIQD
30.0000 mL | Freq: Two times a day (BID) | ORAL | Status: DC
  Administered 2023-01-14 – 2023-01-16 (×4): 30 mL via ORAL
  Filled 2023-01-14 (×4): qty 30

## 2023-01-14 MED ORDER — ADULT MULTIVITAMIN W/MINERALS CH
1.0000 | ORAL_TABLET | Freq: Every day | ORAL | Status: DC
  Administered 2023-01-14 – 2023-01-16 (×3): 1 via ORAL
  Filled 2023-01-14 (×3): qty 1

## 2023-01-14 NOTE — Progress Notes (Signed)
PROGRESS NOTE    Luis Johns  NGE:[id number] DOB: 01/13/1971 DOA: 01/09/2023 PCP: Tresa Garter, MD   Brief Narrative: 52-year significant for this type II, obesity hands with concern of infection of his left leg.  A week ago he fell out of the bed and hit his leg with his bedside dresser.  The next few days he had developed significant redness, swelling.  Evaluation in the ED he had an I&D with 20 cc of purulent material  drained.  Patient was a started on IV antibiotics, orthopedic was consulted, Dr. Lajoyce Corners evaluated the patient.  Plan to continue with IV antibiotic, if no significant improvement by Friday he might need I&D in the OR   Assessment & Plan:   Principal Problem:   Cellulitis and abscess of left leg Active Problems:   Hypokalemia   1-Purulent cellulitis with abscess left lower extremity: -Blood Cultures no growth today -Culture from wound growing MSSA -Doppler lower extremity negative for DVT -CT left lower extremity showed possible 5 x 5 fluid collection within the medial soft tissues of the lower leg Continue with IV Ancef  2-Diabetes type 2:  New diagnosis A1c 6.9 Will need medication at discharge  Obesity: Need lifestyle modifications        Nutrition Problem: Increased nutrient needs Etiology: wound healing    Signs/Symptoms: estimated needs    Interventions: Prostat, MVI  Estimated body mass index is 35.14 kg/m as calculated from the following:   Height as of this encounter: 5\' 10"  (1.778 m).   Weight as of this encounter: 111.1 kg.   DVT prophylaxis: Lovenox Code Status: Full code Family Communication: care discussed  with patient Disposition Plan:  Status is: Inpatient Remains inpatient appropriate because: management of cellulitis abscess.     Consultants:  Dr Lajoyce Corners  Procedures:    Antimicrobials:    Subjective: He report less pain, less redness  Objective: Vitals:   01/13/23 2231 01/14/23 0516 01/14/23 0517  01/14/23 0753  BP: 124/87 127/83 123/81 127/85  Pulse: 64 64 62 64  Resp:  19    Temp:  99 F (37.2 C)  98.2 F (36.8 C)  TempSrc:  Oral  Oral  SpO2: 98% 95% 95% 99%  Weight:      Height:       No intake or output data in the 24 hours ending 01/14/23 1515 Filed Weights   01/09/23 1706  Weight: 111.1 kg    Examination:  General exam: Appears calm and comfortable  Respiratory system: Clear to auscultation. Respiratory effort normal. Cardiovascular system: S1 & S2 heard, RRR.  Gastrointestinal system: Abdomen is nondistended, soft and nontender. No organomegaly or masses felt. Normal bowel sounds heard. Extremities: left LE edema, redness, draining.   Data Reviewed: I have personally reviewed following labs and imaging studies  CBC: Recent Labs  Lab 01/09/23 1715 01/10/23 0222 01/11/23 0630 01/12/23 0851 01/13/23 0540 01/14/23 1202  WBC 8.6 9.2 7.4 8.1 8.2 6.6  NEUTROABS 5.4  --  3.8 4.7 4.5 4.0  HGB 13.3 13.0 12.8* 13.1 12.7* 13.4  HCT 41.4 40.1 39.0 38.9* 39.2 40.0  MCV 91.4 92.8 91.8 92.0 90.5 90.3  PLT 299 282 291 327 341 360   Basic Metabolic Panel: Recent Labs  Lab 01/09/23 1715 01/11/23 0630 01/12/23 0851 01/13/23 0540 01/14/23 0951  NA 136 137 137 137 135  K 3.4* 4.2 4.1 4.1 3.7  CL 102 108 104 103 104  CO2 24 24 26 25 22   GLUCOSE 98 129*  126* 125* 117*  BUN 12 14 9 12 14   CREATININE 1.10 1.23 1.19 1.24 1.23  CALCIUM 8.6* 8.3* 8.7* 8.8* 9.0   GFR: Estimated Creatinine Clearance: 87.6 mL/min (by C-G formula based on SCr of 1.23 mg/dL). Liver Function Tests: No results for input(s): "AST", "ALT", "ALKPHOS", "BILITOT", "PROT", "ALBUMIN" in the last 168 hours. No results for input(s): "LIPASE", "AMYLASE" in the last 168 hours. No results for input(s): "AMMONIA" in the last 168 hours. Coagulation Profile: No results for input(s): "INR", "PROTIME" in the last 168 hours. Cardiac Enzymes: No results for input(s): "CKTOTAL", "CKMB", "CKMBINDEX",  "TROPONINI" in the last 168 hours. BNP (last 3 results) No results for input(s): "PROBNP" in the last 8760 hours. HbA1C: Recent Labs    01/12/23 0851  HGBA1C 6.6*   CBG: Recent Labs  Lab 01/13/23 1224 01/13/23 1724 01/13/23 2234 01/14/23 0754 01/14/23 1203  GLUCAP 174* 104* 129* 122* 129*   Lipid Profile: No results for input(s): "CHOL", "HDL", "LDLCALC", "TRIG", "CHOLHDL", "LDLDIRECT" in the last 72 hours. Thyroid Function Tests: No results for input(s): "TSH", "T4TOTAL", "FREET4", "T3FREE", "THYROIDAB" in the last 72 hours. Anemia Panel: No results for input(s): "VITAMINB12", "FOLATE", "FERRITIN", "TIBC", "IRON", "RETICCTPCT" in the last 72 hours. Sepsis Labs: Recent Labs  Lab 01/09/23 2310  LATICACIDVEN 0.9    Recent Results (from the past 240 hour(s))  Culture, blood (single)     Status: None   Collection Time: 01/09/23 11:06 PM   Specimen: BLOOD  Result Value Ref Range Status   Specimen Description BLOOD LEFT ANTECUBITAL  Final   Special Requests   Final    BOTTLES DRAWN AEROBIC AND ANAEROBIC Blood Culture results may not be optimal due to an excessive volume of blood received in culture bottles   Culture   Final    NO GROWTH 5 DAYS Performed at [facility], [address], [place], [place] [address]    Report Status 01/14/2023 FINAL  Final  Body fluid culture w Gram Stain     Status: Abnormal   Collection Time: 01/10/23  1:04 AM   Specimen: Abscess; Body Fluid  Result Value Ref Range Status   Specimen Description ABSCESS  Final   Special Requests LEFT LOWER LEG  Final   Gram Stain   Final    ABUNDANT WBC PRESENT,BOTH PMN AND MONONUCLEAR FEW GRAM POSITIVE COCCI IN CLUSTERS Performed at [facility], [address], [place], [place] [address]    Culture STAPHYLOCOCCUS AUREUS (A)  Final   Report Status 01/12/2023 FINAL  Final   Organism ID, Bacteria STAPHYLOCOCCUS AUREUS  Final      Susceptibility   Staphylococcus aureus - MIC*     CIPROFLOXACIN <=0.5 SENSITIVE Sensitive     ERYTHROMYCIN <=0.25 SENSITIVE Sensitive     GENTAMICIN <=0.5 SENSITIVE Sensitive     OXACILLIN <=0.25 SENSITIVE Sensitive     TETRACYCLINE <=1 SENSITIVE Sensitive     VANCOMYCIN 1 SENSITIVE Sensitive     TRIMETH/SULFA <=10 SENSITIVE Sensitive     CLINDAMYCIN <=0.25 SENSITIVE Sensitive     RIFAMPIN <=0.5 SENSITIVE Sensitive     Inducible Clindamycin NEGATIVE Sensitive     LINEZOLID 2 SENSITIVE Sensitive     * STAPHYLOCOCCUS AUREUS         Radiology Studies: No results found.      Scheduled Meds:  (feeding supplement) PROSource Plus  30 mL Oral BID BM   enoxaparin (LOVENOX) injection  55 mg Subcutaneous Q24H   insulin aspart  0-5  Units Subcutaneous QHS   insulin aspart  0-9 Units Subcutaneous TID WC   multivitamin with minerals  1 tablet Oral Daily   Continuous Infusions:   ceFAZolin (ANCEF) IV 2 g (01/14/23 1208)     LOS: 3 days    Time spent: 35 minutes    Daysha Ashmore A Gemini Bunte, MD [facility]   If 7PM-7AM, please contact night-coverage [email or web address]  01/14/2023, 3:15 PM

## 2023-01-14 NOTE — Discharge Instructions (Signed)
Carbohydrate Counting For People With Diabetes  Foods with carbohydrates make your blood glucose level go up. Learning how to count carbohydrates can help you control your blood glucose levels. First, identify the foods you eat that contain carbohydrates. Then, using the Foods with Carbohydrates chart, determine about how much carbohydrates are in your meals and snacks. Make sure you are eating foods with fiber, protein, and healthy fat along with your carbohydrate foods. Foods with Carbohydrates The following table shows carbohydrate foods that have about 15 grams of carbohydrate each. Using measuring cups, spoons, or a food scale when you first begin learning about carbohydrate counting can help you learn about the portion sizes you typically eat. The following foods have 15 grams carbohydrate each:  Grains 1 slice bread (1 ounce)  1 small tortilla (6-inch size)   large bagel (1 ounce)  1/3 cup pasta or rice (cooked)   hamburger or hot dog bun ( ounce)   cup cooked cereal   to  cup ready-to-eat cereal  2 taco shells (5-inch size) Fruit 1 small fresh fruit ( to 1 cup)   medium banana  17 small grapes (3 ounces)  1 cup melon or berries   cup canned or frozen fruit  2 tablespoons dried fruit (blueberries, cherries, cranberries, raisins)   cup unsweetened fruit juice  Starchy Vegetables  cup cooked beans, peas, corn, potatoes/sweet potatoes   large baked potato (3 ounces)  1 cup acorn or butternut squash  Snack Foods 3 to 6 crackers  8 potato chips or 13 tortilla chips ( ounce to 1 ounce)  3 cups popped popcorn  Dairy 3/4 cup (6 ounces) nonfat plain yogurt, or yogurt with sugar-free sweetener  1 cup milk  1 cup plain rice, soy, coconut or flavored almond milk Sweets and Desserts  cup ice cream or frozen yogurt  1 tablespoon jam, jelly, pancake syrup, table sugar, or honey  2 tablespoons light pancake syrup  1 inch square of frosted cake or 2 inch square of unfrosted  cake  2 small cookies (2/3 ounce each) or  large cookie  Sometimes youll have to estimate carbohydrate amounts if you dont know the exact recipe. One cup of mixed foods like soups can have 1 to 2 carbohydrate servings, while some casseroles might have 2 or more servings of carbohydrate. Foods that have less than 20 calories in each serving can be counted as free foods. Count 1 cup raw vegetables, or  cup cooked non-starchy vegetables as free foods. If you eat 3 or more servings at one meal, then count them as 1 carbohydrate serving.  Foods without Carbohydrates  Not all foods contain carbohydrates. Meat, some dairy, fats, non-starchy vegetables, and many beverages dont contain carbohydrate. So when you count carbohydrates, you can generally exclude chicken, pork, beef, fish, seafood, eggs, tofu, cheese, butter, sour cream, avocado, nuts, seeds, olives, mayonnaise, water, black coffee, unsweetened tea, and zero-calorie drinks. Vegetables with no or low carbohydrate include green beans, cauliflower, tomatoes, and onions. How much carbohydrate should I eat at each meal?  Carbohydrate counting can help you plan your meals and manage your weight. Following are some starting points for carbohydrate intake at each meal. Work with your registered dietitian nutritionist to find the best range that works for your blood glucose and weight.   To Lose Weight To Maintain Weight  Women 2 - 3 carb servings 3 - 4 carb servings  Men 3 - 4 carb servings 4 - 5 carb servings  Checking your  blood glucose after meals will help you know if you need to adjust the timing, type, or number of carbohydrate servings in your meal plan. Achieve and keep a healthy body weight by balancing your food intake and physical activity.  Tips How should I plan my meals?  Plan for half the food on your plate to include non-starchy vegetables, like salad greens, broccoli, or carrots. Try to eat 3 to 5 servings of non-starchy vegetables  every day. Have a protein food at each meal. Protein foods include chicken, fish, meat, eggs, or beans (note that beans contain carbohydrate). These two food groups (non-starchy vegetables and proteins) are low in carbohydrate. If you fill up your plate with these foods, you will eat less carbohydrate but still fill up your stomach. Try to limit your carbohydrate portion to  of the plate.  What fats are healthiest to eat?  Diabetes increases risk for heart disease. To help protect your heart, eat more healthy fats, such as olive oil, nuts, and avocado. Eat less saturated fats like butter, cream, and high-fat meats, like bacon and sausage. Avoid trans fats, which are in all foods that list partially hydrogenated oil as an ingredient. What should I drink?  Choose drinks that are not sweetened with sugar. The healthiest choices are water, carbonated or seltzer waters, and tea and coffee without added sugars.  Sweet drinks will make your blood glucose go up very quickly. One serving of soda or energy drink is  cup. It is best to drink these beverages only if your blood glucose is low.  Artificially sweetened, or diet drinks, typically do not increase your blood glucose if they have zero calories in them. Read labels of beverages, as some diet drinks do have carbohydrate and will raise your blood glucose. Label Reading Tips Read Nutrition Facts labels to find out how many grams of carbohydrate are in a food you want to eat. Dont forget: sometimes serving sizes on the label arent the same as how much food you are going to eat, so you may need to calculate how much carbohydrate is in the food you are serving yourself.   Carbohydrate Counting for People with Diabetes Sample 1-Day Menu  Breakfast  cup yogurt, low fat, low sugar (1 carbohydrate serving)   cup cereal, ready-to-eat, unsweetened (1 carbohydrate serving)  1 cup strawberries (1 carbohydrate serving)   cup almonds ( carbohydrate serving)   Lunch 1, 5 ounce can chunk light tuna  2 ounces cheese, low fat cheddar  6 whole wheat crackers (1 carbohydrate serving)  1 small apple (1 carbohydrate servings)   cup carrots ( carbohydrate serving)   cup snap peas  1 cup 1% milk (1 carbohydrate serving)   Evening Meal Stir fry made with: 3 ounces chicken  1 cup brown rice (3 carbohydrate servings)   cup broccoli ( carbohydrate serving)   cup green beans   cup onions  1 tablespoon olive oil  2 tablespoons teriyaki sauce ( carbohydrate serving)  Evening Snack 1 extra small banana (1 carbohydrate serving)  1 tablespoon peanut butter   Carbohydrate Counting for People with Diabetes Vegan Sample 1-Day Menu  Breakfast 1 cup cooked oatmeal (2 carbohydrate servings)   cup blueberries (1 carbohydrate serving)  2 tablespoons flaxseeds  1 cup soymilk fortified with calcium and vitamin D  1 cup coffee  Lunch 2 slices whole wheat bread (2 carbohydrate servings)   cup baked tofu   cup lettuce  2 slices tomato  2 slices avocado  cup baby carrots ( carbohydrate serving)  1 orange (1 carbohydrate serving)  1 cup soymilk fortified with calcium and vitamin D   Evening Meal Burrito made with: 1 6-inch corn tortilla (1 carbohydrate serving)  1 cup refried vegetarian beans (2 carbohydrate servings)   cup chopped tomatoes   cup lettuce   cup salsa  1/3 cup brown rice (1 carbohydrate serving)  1 tablespoon olive oil for rice   cup zucchini   Evening Snack 6 small whole grain crackers (1 carbohydrate serving)  2 apricots ( carbohydrate serving)   cup unsalted peanuts ( carbohydrate serving)    Carbohydrate Counting for People with Diabetes Vegetarian (Lacto-Ovo) Sample 1-Day Menu  Breakfast 1 cup cooked oatmeal (2 carbohydrate servings)   cup blueberries (1 carbohydrate serving)  2 tablespoons flaxseeds  1 egg  1 cup 1% milk (1 carbohydrate serving)  1 cup coffee  Lunch 2 slices whole wheat bread (2 carbohydrate  servings)  2 ounces low-fat cheese   cup lettuce  2 slices tomato  2 slices avocado   cup baby carrots ( carbohydrate serving)  1 orange (1 carbohydrate serving)  1 cup unsweetened tea  Evening Meal Burrito made with: 1 6-inch corn tortilla (1 carbohydrate serving)   cup refried vegetarian beans (1 carbohydrate serving)   cup tomatoes   cup lettuce   cup salsa  1/3 cup brown rice (1 carbohydrate serving)  1 tablespoon olive oil for rice   cup zucchini  1 cup 1% milk (1 carbohydrate serving)  Evening Snack 6 small whole grain crackers (1 carbohydrate serving)  2 apricots ( carbohydrate serving)   cup unsalted peanuts ( carbohydrate serving)    Copyright 2020  Academy of Nutrition and Dietetics. All rights reserved.  Using Nutrition Labels: Carbohydrate  Serving Size  Look at the serving size. All the information on the label is based on this portion. Servings Per Container  The number of servings contained in the package. Guidelines for Carbohydrate  Look at the total grams of carbohydrate in the serving size.  1 carbohydrate choice = 15 grams of carbohydrate. Range of Carbohydrate Grams Per Choice  Carbohydrate Grams/Choice Carbohydrate Choices  6-10   11-20 1  21-25 1  26-35 2  36-40 2  41-50 3  51-55 3  56-65 4  66-70 4  71-80 5    Copyright 2020  Academy of Nutrition and Dietetics. All rights reserved.   Plate Method for Diabetes   Foods with carbohydrates make your blood glucose level go up. The plate method is a simple way to meal plan and control the amount of carbohydrate you eat.         Use the following guidance to build a healthy plate to control carbohydrates. Divide a 9-inch plate into 3 sections, and consider your beverage the 4th section of your meal: Food Group Examples of Foods/Beverages for This Section of your Meal  Section 1: Non-starchy vegetables Fill  of your plate to include non-starchy vegetables Asparagus, broccoli,  brussels sprouts, cabbage, carrots, cauliflower, celery, cucumber, green beans, mushrooms, peppers, salad greens, tomatoes, or zucchini.  Section 2: Protein foods Fill  of your plate to include a lean protein Lean meat, poultry, fish, seafood, cheese, eggs, lean deli meat, tofu, beans, lentils, nuts or nut butters.  Section 3: Carbohydrate foods Fill  of your plate to include carbohydrate foods Whole grains, whole wheat bread, brown rice, whole grain pasta, polenta, corn tortillas, fruit, or starchy vegetables (potatoes, green peas, corn, beans,  acorn squash, and butternut squash). One cup of milk also counts as a food that contains carbohydrate.  Section 4: Beverage Choose water or a low-calorie drink for your beverage. Unsweetened tea, coffee, or flavored/sparkling water without added sugar.  Image reprinted with permission from The American Diabetes Association.  Copyright 2022 by the American Diabetes Association.   Copyright 2022  Academy of Nutrition and Dietetics. All rights reserved

## 2023-01-14 NOTE — Progress Notes (Signed)
Initial Nutrition Assessment  DOCUMENTATION CODES:   Not applicable  INTERVENTION:   Diabetes diet education provided. Handouts added to AVS. Prosource Plus 30 ml PO BID, each packet provides 100 kcal and 15 gm protein.  NUTRITION DIAGNOSIS:   Increased nutrient needs related to wound healing as evidenced by estimated needs.  GOAL:   Patient will meet greater than or equal to 90% of their needs  MONITOR:   PO intake, Supplement acceptance  REASON FOR ASSESSMENT:   Consult Diet education  ASSESSMENT:   52 yo male admitted with L leg infection/abscess/cellulitis. No significant PMH.  Patient reports that he has been eating food brought in by family for all meals. They bring him breakfast, lunch, and dinner every day. He is not eating the meals provided by the kitchen. He has a good appetite and has been eating well. Discussed the importance of adequate protein intake to support wound healing. He agrees to try Prosource Plus protein supplement to maximize protein intake.    Also provided diet education for new onset diabetes.  RD added "Carbohydrate Counting for People with Diabetes" handout from the Academy of Nutrition and Dietetics to discharge instructions. Spoke with patient over the phone and discussed different food groups and their effects on blood sugar, emphasizing carbohydrate-containing foods. Provided list of carbohydrates and recommended serving sizes of common foods. Discussed importance of controlled and consistent carbohydrate intake throughout the day. Provided examples of ways to balance meals/snacks and encouraged intake of high-fiber, whole grain complex carbohydrates. Teach back method used. Expect good compliance.  Labs reviewed. A1C 6.6 CBG: 122-129  Medications reviewed and include Novolog SSI, IV Ancef.  Diet Order:   Diet Order             Diet regular Room service appropriate? Yes; Fluid consistency: Thin  Diet effective now                    EDUCATION NEEDS:   Education needs have been addressed  Skin:  Skin Assessment: Skin Integrity Issues: Skin Integrity Issues:: Other (Comment) Other: L leg abscess/infection/cellulitis  Last BM:  9/14  Height:   Ht Readings from Last 1 Encounters:  01/09/23 5\' 10"  (1.778 m)    Weight:   Wt Readings from Last 1 Encounters:  01/09/23 111.1 kg    BMI:  Body mass index is 35.14 kg/m.  Estimated Nutritional Needs:   Kcal:  2200-2400  Protein:  115-130 gm  Fluid:  2.2-2.4 L   Gabriel Rainwater RD, LDN, CNSC Please refer to Amion for contact information.

## 2023-01-15 DIAGNOSIS — L02416 Cutaneous abscess of left lower limb: Secondary | ICD-10-CM | POA: Diagnosis not present

## 2023-01-15 DIAGNOSIS — L03116 Cellulitis of left lower limb: Secondary | ICD-10-CM | POA: Diagnosis not present

## 2023-01-15 LAB — GLUCOSE, CAPILLARY
Glucose-Capillary: 130 mg/dL — ABNORMAL HIGH (ref 70–99)
Glucose-Capillary: 101 mg/dL — ABNORMAL HIGH (ref 70–99)
Glucose-Capillary: 132 mg/dL — ABNORMAL HIGH (ref 70–99)
Glucose-Capillary: 102 mg/dL — ABNORMAL HIGH (ref 70–99)
Glucose-Capillary: 155 mg/dL — ABNORMAL HIGH (ref 70–99)

## 2023-01-15 NOTE — Progress Notes (Signed)
PROGRESS NOTE    Luis Johns  ZHY:[id number] DOB: Sep 19, 1970 DOA: 01/09/2023 PCP: Tresa Garter, MD   Brief Narrative: 52-year significant for this type II, obesity hands with concern of infection of his left leg.  A week ago he fell out of the bed and hit his leg with his bedside dresser.  The next few days he had developed significant redness, swelling.  Evaluation in the ED he had an I&D with 20 cc of purulent material  drained.  Patient was a started on IV antibiotics, orthopedic was consulted, Dr. Lajoyce Corners evaluated the patient.  Plan to continue with IV antibiotic, if no significant improvement by Friday he might need I&D in the OR   Assessment & Plan:   Principal Problem:   Cellulitis and abscess of left leg Active Problems:   Hypokalemia   1-Purulent cellulitis with abscess left lower extremity: -Blood Cultures no growth today -Culture from wound growing MSSA -Doppler lower extremity negative for DVT -CT left lower extremity showed possible 5 x 5 fluid collection within the medial soft tissues of the lower leg Continue with IV Ancef Plan to keep one more day IV antibiotics. Hopefully home tomorrow.   2-Diabetes type 2:  New diagnosis A1c 6.9 Will need medication at discharge  Obesity: Need lifestyle modifications        Nutrition Problem: Increased nutrient needs Etiology: wound healing    Signs/Symptoms: estimated needs    Interventions: Prostat, MVI  Estimated body mass index is 35.14 kg/m as calculated from the following:   Height as of this encounter: 5\' 10"  (1.778 m).   Weight as of this encounter: 111.1 kg.   DVT prophylaxis: Lovenox Code Status: Full code Family Communication: care discussed  with patient Disposition Plan:  Status is: Inpatient Remains inpatient appropriate because: management of cellulitis abscess.     Consultants:  Dr Lajoyce Corners  Procedures:    Antimicrobials:    Subjective: Swelling decreasing, redness  decreasing.   Objective: Vitals:   01/14/23 1707 01/14/23 1929 01/15/23 0615 01/15/23 0825  BP: 125/73 130/85 126/81 132/81  Pulse: 66 65 68 65  Resp:  18 18   Temp: 98.4 F (36.9 C) 98.7 F (37.1 C) 98.5 F (36.9 C) 98.5 F (36.9 C)  TempSrc: Oral Oral Oral Oral  SpO2: 96% 100% 96% 96%  Weight:      Height:       No intake or output data in the 24 hours ending 01/15/23 1245 Filed Weights   01/09/23 1706  Weight: 111.1 kg    Examination:  General exam: NAD Respiratory system: CTA Cardiovascular system: S 1, S 2 RRRR Gastrointestinal system: BS present, soft, nt Extremities: less edema, still having redness  Data Reviewed: I have personally reviewed following labs and imaging studies  CBC: Recent Labs  Lab 01/09/23 1715 01/10/23 0222 01/11/23 0630 01/12/23 0851 01/13/23 0540 01/14/23 1202  WBC 8.6 9.2 7.4 8.1 8.2 6.6  NEUTROABS 5.4  --  3.8 4.7 4.5 4.0  HGB 13.3 13.0 12.8* 13.1 12.7* 13.4  HCT 41.4 40.1 39.0 38.9* 39.2 40.0  MCV 91.4 92.8 91.8 92.0 90.5 90.3  PLT 299 282 291 327 341 360   Basic Metabolic Panel: Recent Labs  Lab 01/09/23 1715 01/11/23 0630 01/12/23 0851 01/13/23 0540 01/14/23 0951  NA 136 137 137 137 135  K 3.4* 4.2 4.1 4.1 3.7  CL 102 108 104 103 104  CO2 24 24 26 25 22   GLUCOSE 98 129* 126* 125* 117*  BUN  12 14 9 12 14   CREATININE 1.10 1.23 1.19 1.24 1.23  CALCIUM 8.6* 8.3* 8.7* 8.8* 9.0   GFR: Estimated Creatinine Clearance: 87.6 mL/min (by C-G formula based on SCr of 1.23 mg/dL). Liver Function Tests: No results for input(s): "AST", "ALT", "ALKPHOS", "BILITOT", "PROT", "ALBUMIN" in the last 168 hours. No results for input(s): "LIPASE", "AMYLASE" in the last 168 hours. No results for input(s): "AMMONIA" in the last 168 hours. Coagulation Profile: No results for input(s): "INR", "PROTIME" in the last 168 hours. Cardiac Enzymes: No results for input(s): "CKTOTAL", "CKMB", "CKMBINDEX", "TROPONINI" in the last 168 hours. BNP  (last 3 results) No results for input(s): "PROBNP" in the last 8760 hours. HbA1C: No results for input(s): "HGBA1C" in the last 72 hours.  CBG: Recent Labs  Lab 01/14/23 1705 01/14/23 2101 01/15/23 0721 01/15/23 1124 01/15/23 1209  GLUCAP 121* 112* 130* 101* 132*   Lipid Profile: No results for input(s): "CHOL", "HDL", "LDLCALC", "TRIG", "CHOLHDL", "LDLDIRECT" in the last 72 hours. Thyroid Function Tests: No results for input(s): "TSH", "T4TOTAL", "FREET4", "T3FREE", "THYROIDAB" in the last 72 hours. Anemia Panel: No results for input(s): "VITAMINB12", "FOLATE", "FERRITIN", "TIBC", "IRON", "RETICCTPCT" in the last 72 hours. Sepsis Labs: Recent Labs  Lab 01/09/23 2310  LATICACIDVEN 0.9    Recent Results (from the past 240 hour(s))  Culture, blood (single)     Status: None   Collection Time: 01/09/23 11:06 PM   Specimen: BLOOD  Result Value Ref Range Status   Specimen Description BLOOD LEFT ANTECUBITAL  Final   Special Requests   Final    BOTTLES DRAWN AEROBIC AND ANAEROBIC Blood Culture results may not be optimal due to an excessive volume of blood received in culture bottles   Culture   Final    NO GROWTH 5 DAYS Performed at [facility], [address] [place], [place] [address]    Report Status 01/14/2023 FINAL  Final  Body fluid culture w Gram Stain     Status: Abnormal   Collection Time: 01/10/23  1:04 AM   Specimen: Abscess; Body Fluid  Result Value Ref Range Status   Specimen Description ABSCESS  Final   Special Requests LEFT LOWER LEG  Final   Gram Stain   Final    ABUNDANT WBC PRESENT,BOTH PMN AND MONONUCLEAR FEW GRAM POSITIVE COCCI IN CLUSTERS Performed at [facility], [address], [place], [place] [address]    Culture STAPHYLOCOCCUS AUREUS (A)  Final   Report Status 01/12/2023 FINAL  Final   Organism ID, Bacteria STAPHYLOCOCCUS AUREUS  Final      Susceptibility   Staphylococcus aureus - MIC*    CIPROFLOXACIN <=0.5 SENSITIVE Sensitive      ERYTHROMYCIN <=0.25 SENSITIVE Sensitive     GENTAMICIN <=0.5 SENSITIVE Sensitive     OXACILLIN <=0.25 SENSITIVE Sensitive     TETRACYCLINE <=1 SENSITIVE Sensitive     VANCOMYCIN 1 SENSITIVE Sensitive     TRIMETH/SULFA <=10 SENSITIVE Sensitive     CLINDAMYCIN <=0.25 SENSITIVE Sensitive     RIFAMPIN <=0.5 SENSITIVE Sensitive     Inducible Clindamycin NEGATIVE Sensitive     LINEZOLID 2 SENSITIVE Sensitive     * STAPHYLOCOCCUS AUREUS         Radiology Studies: No results found.      Scheduled Meds:  (feeding supplement) PROSource Plus  30 mL Oral BID BM   enoxaparin (LOVENOX) injection  55 mg Subcutaneous Q24H   insulin aspart  0-5 Units Subcutaneous QHS   insulin  aspart  0-9 Units Subcutaneous TID WC   multivitamin with minerals  1 tablet Oral Daily   Continuous Infusions:   ceFAZolin (ANCEF) IV 2 g (01/15/23 0618)     LOS: 4 days    Time spent: 35 minutes    Franklin Baumbach A Marticia Reifschneider, MD [facility]   If 7PM-7AM, please contact night-coverage [email or web address]  01/15/2023, 12:45 PM

## 2023-01-15 NOTE — Progress Notes (Signed)
Patient ID: Luis Johns, male   DOB: 05-19-1970, 52 y.o.   MRN: [id number] Patient states he is feeling much better.  Patient states he does not have pain with weightbearing.  The skin is wrinkling now the draining hematoma is resolved.  Cellulitis is resolving and the calf is not tender to palpation.  Cultures are showing pansensitive Staph aureus.  I will plan to follow-up in the office 1 week after discharge.  Anticipate discharge on oral antibiotics.

## 2023-01-15 NOTE — Plan of Care (Signed)

## 2023-01-16 ENCOUNTER — Other Ambulatory Visit (HOSPITAL_COMMUNITY): Payer: Self-pay

## 2023-01-16 DIAGNOSIS — L02416 Cutaneous abscess of left lower limb: Secondary | ICD-10-CM | POA: Diagnosis not present

## 2023-01-16 DIAGNOSIS — L03116 Cellulitis of left lower limb: Secondary | ICD-10-CM | POA: Diagnosis not present

## 2023-01-16 LAB — GLUCOSE, CAPILLARY: Glucose-Capillary: 130 mg/dL — ABNORMAL HIGH (ref 70–99)

## 2023-01-16 MED ORDER — ADULT MULTIVITAMIN W/MINERALS CH: 1.0000 | ORAL_TABLET | Freq: Every day | ORAL | Status: DC

## 2023-01-16 MED ORDER — METFORMIN HCL 500 MG PO TABS
500.0000 mg | ORAL_TABLET | Freq: Every day | ORAL | 0 refills | Status: DC
Start: 2023-01-16 — End: 2024-03-16
  Filled 2023-01-16: qty 30, 30d supply, fill #0

## 2023-01-16 MED ORDER — DOXYCYCLINE HYCLATE 100 MG PO TABS: 100.0000 mg | ORAL_TABLET | Freq: Two times a day (BID) | ORAL | Status: DC

## 2023-01-16 MED ORDER — ADULT MULTIVITAMIN W/MINERALS CH
1.0000 | ORAL_TABLET | Freq: Every day | ORAL | 0 refills | Status: AC
  Filled 2023-01-16: qty 130, 130d supply, fill #0

## 2023-01-16 MED ORDER — DOXYCYCLINE HYCLATE 100 MG PO TABS
100.0000 mg | ORAL_TABLET | Freq: Two times a day (BID) | ORAL | 0 refills | Status: AC
  Filled 2023-01-16: qty 14, 7d supply, fill #0

## 2023-01-16 NOTE — Progress Notes (Signed)
Discharge instructions reviewed with pt.  Copy of instructions given to pt. [facility] has filled pt's scripts and will be picked up on the way out for discharge. Pt verbalized understanding of all instructions, and follow up appointments he needs to make.  Pt had diabetes booklet at bedside and placed in discharge envelope with his d/c instructions.  Pt to be d/c'd via wheelchair with belongings. Pt drove himself to the hospital, states his car is at the emergency room parking lot, near the entrance.  Pt will be taken to the ER entrance.           To be escorted by staff.   Lota Leamer,RN SWOT

## 2023-01-16 NOTE — Plan of Care (Signed)
  Problem: Education: Goal: Knowledge of General Education information will improve Description: Including pain rating scale, medication(s)/side effects and non-pharmacologic comfort measures Outcome: Progressing   Problem: Health Behavior/Discharge Planning: Goal: Ability to manage health-related needs will improve Outcome: Progressing   Problem: Clinical Measurements: Goal: Ability to maintain clinical measurements within normal limits will improve Outcome: Progressing   Problem: Nutrition: Goal: Adequate nutrition will be maintained Outcome: Progressing   Problem: Safety: Goal: Ability to remain free from injury will improve Outcome: Progressing   Problem: Education: Goal: Ability to describe self-care measures that may prevent or decrease complications (Diabetes Survival Skills Education) will improve Outcome: Progressing   Problem: Coping: Goal: Ability to adjust to condition or change in health will improve Outcome: Progressing

## 2023-01-16 NOTE — Discharge Summary (Incomplete)
Physician Discharge Summary   Patient: Luis Johns MRN: [id number] DOB: April 20, 1971  Admit date:     01/09/2023  Discharge date: 01/16/23  Discharge Physician: Alba Cory   PCP: Tresa Garter, MD   Recommendations at discharge:  {Tip this will not be part of the note when signed- Example include specific recommendations for outpatient follow-up, pending tests to follow-up on. (Optional):26781}    Discharge Diagnoses: Principal Problem:   Cellulitis and abscess of left leg Active Problems:   Hypokalemia  Resolved Problems:   * No resolved hospital problems. [facility] Course: No notes on file  Assessment and Plan: No notes have been filed under this hospital service. Service: Hospitalist     {Tip this will not be part of the note when signed Body mass index is 35.14 kg/m. ,  Nutrition Documentation    Flowsheet Row ED to Hosp-Admission (Current) from 01/09/2023 in [facility] GENERAL MED/SURG UNIT  Nutrition Problem Increased nutrient needs  Etiology wound healing  Nutrition Goal Patient will meet greater than or equal to 90% of their needs  Interventions Prostat, MVI     ,  (Optional):26781}  {(NOTE) Pain control PDMP Statment (Optional):26782} Consultants: *** Procedures performed: ***  Disposition: {Plan; Disposition:26390} Diet recommendation:  {Diet_Plan:26776} DISCHARGE MEDICATION: Allergies as of 01/16/2023   No Known Allergies      Medication List     STOP taking these medications    ciclopirox 8 % solution Commonly known as: PENLAC       TAKE these medications    albuterol 108 (90 Base) MCG/ACT inhaler Commonly known as: VENTOLIN HFA Inhale 1-2 puffs into the lungs every 6 (six) hours as needed for wheezing or shortness of breath. What changed: how much to take   doxycycline 100 MG tablet Commonly known as: VIBRA-TABS Take 1 tablet (100 mg total) by mouth every 12 (twelve) hours for 7 days.   metformin 500 MG  (OSM) 24 hr tablet Commonly known as: FORTAMET Take 1 tablet (500 mg total) by mouth daily with breakfast.   multivitamin with minerals Tabs tablet Take 1 tablet by mouth daily. Start taking on: January 17, 2023   naproxen sodium 220 MG tablet Commonly known as: ALEVE Take 660 mg by mouth every 4 (four) hours as needed (pain).        Follow-up Information     Nadara Mustard, MD Follow up in 1 week(s).   Specialty: Orthopedic Surgery Contact information: [address] [place] [address] [phone]                Discharge Exam: Ceasar Mons Weights   01/09/23 1706  Weight: 111.1 kg   ***  Condition at discharge: {DC Condition:26389}  The results of significant diagnostics from this hospitalization (including imaging, microbiology, ancillary and laboratory) are listed below for reference.   Imaging Studies: CT EXTREMITY LOWER LEFT W CONTRAST  Result Date: 01/12/2023 CLINICAL DATA:  Left lower leg infection with persistent swelling and erythema. History of incision and drainage EXAM: CT OF THE LOWER LEFT EXTREMITY WITH CONTRAST TECHNIQUE: Multidetector CT imaging of the lower left extremity was performed according to the standard protocol following intravenous contrast administration. RADIATION DOSE REDUCTION: This exam was performed according to the departmental dose-optimization program which includes automated exposure control, adjustment of the mA and/or kV according to patient size and/or use of iterative reconstruction technique. CONTRAST:  75mL OMNIPAQUE IOHEXOL 350 MG/ML SOLN COMPARISON:  X-ray 01/09/2023 FINDINGS: Bones/Joint/Cartilage No acute fracture. No  Physician Discharge Summary   Patient: Luis Johns MRN: [id number] DOB: April 20, 1971  Admit date:     01/09/2023  Discharge date: 01/16/23  Discharge Physician: Alba Cory   PCP: Tresa Garter, MD   Recommendations at discharge:  {Tip this will not be part of the note when signed- Example include specific recommendations for outpatient follow-up, pending tests to follow-up on. (Optional):26781}    Discharge Diagnoses: Principal Problem:   Cellulitis and abscess of left leg Active Problems:   Hypokalemia  Resolved Problems:   * No resolved hospital problems. [facility] Course: No notes on file  Assessment and Plan: No notes have been filed under this hospital service. Service: Hospitalist     {Tip this will not be part of the note when signed Body mass index is 35.14 kg/m. ,  Nutrition Documentation    Flowsheet Row ED to Hosp-Admission (Current) from 01/09/2023 in [facility] GENERAL MED/SURG UNIT  Nutrition Problem Increased nutrient needs  Etiology wound healing  Nutrition Goal Patient will meet greater than or equal to 90% of their needs  Interventions Prostat, MVI     ,  (Optional):26781}  {(NOTE) Pain control PDMP Statment (Optional):26782} Consultants: *** Procedures performed: ***  Disposition: {Plan; Disposition:26390} Diet recommendation:  {Diet_Plan:26776} DISCHARGE MEDICATION: Allergies as of 01/16/2023   No Known Allergies      Medication List     STOP taking these medications    ciclopirox 8 % solution Commonly known as: PENLAC       TAKE these medications    albuterol 108 (90 Base) MCG/ACT inhaler Commonly known as: VENTOLIN HFA Inhale 1-2 puffs into the lungs every 6 (six) hours as needed for wheezing or shortness of breath. What changed: how much to take   doxycycline 100 MG tablet Commonly known as: VIBRA-TABS Take 1 tablet (100 mg total) by mouth every 12 (twelve) hours for 7 days.   metformin 500 MG  (OSM) 24 hr tablet Commonly known as: FORTAMET Take 1 tablet (500 mg total) by mouth daily with breakfast.   multivitamin with minerals Tabs tablet Take 1 tablet by mouth daily. Start taking on: January 17, 2023   naproxen sodium 220 MG tablet Commonly known as: ALEVE Take 660 mg by mouth every 4 (four) hours as needed (pain).        Follow-up Information     Nadara Mustard, MD Follow up in 1 week(s).   Specialty: Orthopedic Surgery Contact information: [address] [place] [address] [phone]                Discharge Exam: Ceasar Mons Weights   01/09/23 1706  Weight: 111.1 kg   ***  Condition at discharge: {DC Condition:26389}  The results of significant diagnostics from this hospitalization (including imaging, microbiology, ancillary and laboratory) are listed below for reference.   Imaging Studies: CT EXTREMITY LOWER LEFT W CONTRAST  Result Date: 01/12/2023 CLINICAL DATA:  Left lower leg infection with persistent swelling and erythema. History of incision and drainage EXAM: CT OF THE LOWER LEFT EXTREMITY WITH CONTRAST TECHNIQUE: Multidetector CT imaging of the lower left extremity was performed according to the standard protocol following intravenous contrast administration. RADIATION DOSE REDUCTION: This exam was performed according to the departmental dose-optimization program which includes automated exposure control, adjustment of the mA and/or kV according to patient size and/or use of iterative reconstruction technique. CONTRAST:  75mL OMNIPAQUE IOHEXOL 350 MG/ML SOLN COMPARISON:  X-ray 01/09/2023 FINDINGS: Bones/Joint/Cartilage No acute fracture. No  Physician Discharge Summary   Patient: Luis Johns MRN: [id number] DOB: April 20, 1971  Admit date:     01/09/2023  Discharge date: 01/16/23  Discharge Physician: Alba Cory   PCP: Tresa Garter, MD   Recommendations at discharge:  {Tip this will not be part of the note when signed- Example include specific recommendations for outpatient follow-up, pending tests to follow-up on. (Optional):26781}    Discharge Diagnoses: Principal Problem:   Cellulitis and abscess of left leg Active Problems:   Hypokalemia  Resolved Problems:   * No resolved hospital problems. [facility] Course: No notes on file  Assessment and Plan: No notes have been filed under this hospital service. Service: Hospitalist     {Tip this will not be part of the note when signed Body mass index is 35.14 kg/m. ,  Nutrition Documentation    Flowsheet Row ED to Hosp-Admission (Current) from 01/09/2023 in [facility] GENERAL MED/SURG UNIT  Nutrition Problem Increased nutrient needs  Etiology wound healing  Nutrition Goal Patient will meet greater than or equal to 90% of their needs  Interventions Prostat, MVI     ,  (Optional):26781}  {(NOTE) Pain control PDMP Statment (Optional):26782} Consultants: *** Procedures performed: ***  Disposition: {Plan; Disposition:26390} Diet recommendation:  {Diet_Plan:26776} DISCHARGE MEDICATION: Allergies as of 01/16/2023   No Known Allergies      Medication List     STOP taking these medications    ciclopirox 8 % solution Commonly known as: PENLAC       TAKE these medications    albuterol 108 (90 Base) MCG/ACT inhaler Commonly known as: VENTOLIN HFA Inhale 1-2 puffs into the lungs every 6 (six) hours as needed for wheezing or shortness of breath. What changed: how much to take   doxycycline 100 MG tablet Commonly known as: VIBRA-TABS Take 1 tablet (100 mg total) by mouth every 12 (twelve) hours for 7 days.   metformin 500 MG  (OSM) 24 hr tablet Commonly known as: FORTAMET Take 1 tablet (500 mg total) by mouth daily with breakfast.   multivitamin with minerals Tabs tablet Take 1 tablet by mouth daily. Start taking on: January 17, 2023   naproxen sodium 220 MG tablet Commonly known as: ALEVE Take 660 mg by mouth every 4 (four) hours as needed (pain).        Follow-up Information     Nadara Mustard, MD Follow up in 1 week(s).   Specialty: Orthopedic Surgery Contact information: [address] [place] [address] [phone]                Discharge Exam: Ceasar Mons Weights   01/09/23 1706  Weight: 111.1 kg   ***  Condition at discharge: {DC Condition:26389}  The results of significant diagnostics from this hospitalization (including imaging, microbiology, ancillary and laboratory) are listed below for reference.   Imaging Studies: CT EXTREMITY LOWER LEFT W CONTRAST  Result Date: 01/12/2023 CLINICAL DATA:  Left lower leg infection with persistent swelling and erythema. History of incision and drainage EXAM: CT OF THE LOWER LEFT EXTREMITY WITH CONTRAST TECHNIQUE: Multidetector CT imaging of the lower left extremity was performed according to the standard protocol following intravenous contrast administration. RADIATION DOSE REDUCTION: This exam was performed according to the departmental dose-optimization program which includes automated exposure control, adjustment of the mA and/or kV according to patient size and/or use of iterative reconstruction technique. CONTRAST:  75mL OMNIPAQUE IOHEXOL 350 MG/ML SOLN COMPARISON:  X-ray 01/09/2023 FINDINGS: Bones/Joint/Cartilage No acute fracture. No  dislocation. No erosion or periosteal elevation. No tibiotalar or subtalar joint effusion. No lytic or sclerotic bone lesion. Ligaments Suboptimally assessed by CT. Muscles and Tendons Musculotendinous structures within normal limits by CT. No intramuscular fluid collection. No tenosynovial fluid collections.  Soft tissues Partially rim enhancing fluid collection within the medial subcutaneous soft tissues of the lower leg at the level of the distal tibial diaphysis measuring approximately 5.0 x 1.0 x 5.0 cm (series 5, image 46). Collection appears to extend to the overlying skin surface which may be related to site of previous incision or reflect a draining sinus tract. There is overlying bandaging material at this location. There are a few foci of air within the collection. Generalized subcutaneous edema. No additional organized fluid collections. IMPRESSION: 1. Partially rim-enhancing fluid collection within the medial subcutaneous soft tissues of the lower leg at the level of the distal tibial diaphysis measuring approximately 5.0 x 1.0 x 5.0 cm. Collection appears to extend to the overlying skin surface which may be related to site of previous incision or reflect a draining sinus tract. There are a few foci of air within the collection. 2. Generalized subcutaneous edema. 3. No acute osseous abnormality. Electronically Signed   By: Duanne Guess D.O.   On: 01/12/2023 11:40   VAS [place] LOWER EXTREMITY VENOUS (DVT)  Result Date: 01/10/2023  Lower Venous DVT Study Patient Name:  BETHANY MAYTON  Date of Exam:   01/10/2023 Medical Rec #: [id number]         Accession #:    [id number] Date of Birth: 1971-02-13         Patient Gender: M Patient Age:   52 years Exam Location[facility]  [facility] Procedure:      VAS [place] LOWER EXTREMITY VENOUS (DVT) Referring Phys: Dolly Rias --------------------------------------------------------------------------------  Indications: Pain, Swelling, Erythema, and Abscess of left calf, status post I&D.  Comparison Study: No prior study on file Performing Technologist: Sherren Kerns RVS  Examination Guidelines: A complete evaluation includes B-mode imaging, spectral Doppler, color Doppler, and power Doppler as needed of all accessible portions of each vessel. Bilateral testing is  considered an integral part of a complete examination. Limited examinations for reoccurring indications may be performed as noted. The reflux portion of the exam is performed with the patient in reverse Trendelenburg.  +-----+---------------+---------+-----------+----------+--------------+ RIGHTCompressibilityPhasicitySpontaneityPropertiesThrombus Aging +-----+---------------+---------+-----------+----------+--------------+ CFV  Full           Yes      Yes                                 +-----+---------------+---------+-----------+----------+--------------+   +---------+---------------+---------+-----------+----------+--------------+ LEFT     CompressibilityPhasicitySpontaneityPropertiesThrombus Aging +---------+---------------+---------+-----------+----------+--------------+ CFV      Full           Yes      Yes                                 +---------+---------------+---------+-----------+----------+--------------+ SFJ      Full                                                        +---------+---------------+---------+-----------+----------+--------------+ FV Prox  Full                                                        +---------+---------------+---------+-----------+----------+--------------+

## 2023-01-16 NOTE — Discharge Summary (Signed)
Physician Discharge Summary   Patient: Luis Johns MRN: [id number] DOB: 06/22/1970  Admit date:     01/09/2023  Discharge date: 01/16/23  Discharge Physician: Alba Cory   PCP: Tresa Garter, MD   Recommendations at discharge:   Follow up with Dr Lajoyce Corners for further care cellulitis/abscess.  Further adjustment of medication for diabetes.   Discharge Diagnoses: Principal Problem:   Cellulitis and abscess of left leg Active Problems:   Hypokalemia  Resolved Problems:   * No resolved hospital problems. [place] [facility] Course: 52-year significant for this type II, obesity hands with concern of infection of his left leg. A week ago he fell out of the bed and hit his leg with his bedside dresser. The next few days he had developed significant redness, swelling. Evaluation in the ED he had an I&D with 20 cc of purulent material drained. Patient was a started on IV antibiotics, orthopedic was consulted, Dr. Lajoyce Corners evaluated the patient. Patient was treated   with IV antibiotic, he improved clinically. He was discharge on Doxycycline.     Assessment and Plan: 1-Purulent cellulitis with abscess left lower extremity: -Blood Cultures no growth today -Culture from wound growing MSSA -Doppler lower extremity negative for DVT -CT left lower extremity showed possible 5 x 5 fluid collection within the medial soft tissues of the lower leg Continue with IV Ancef Redness improving, swelling decreasing. He will be discharge on Doxy for 1 week. Close follow up with Dr Lajoyce Corners.   2-Diabetes type 2:  New diagnosis A1c 6.9 Start him on metformin.   Obesity: Need lifestyle modifications            Consultants: Dr Lajoyce Corners Procedures performed: none Disposition: Home Diet recommendation:  Discharge Diet Orders (From admission, onward)     Start     Ordered   01/16/23 0000  Diet - low sodium heart healthy        01/16/23 1124           Cardiac diet DISCHARGE  MEDICATION: Allergies as of 01/16/2023   No Known Allergies      Medication List     STOP taking these medications    ciclopirox 8 % solution Commonly known as: PENLAC       TAKE these medications    albuterol 108 (90 Base) MCG/ACT inhaler Commonly known as: VENTOLIN HFA Inhale 1-2 puffs into the lungs every 6 (six) hours as needed for wheezing or shortness of breath. What changed: how much to take   CertaVite/Antioxidants Tabs Take 1 tablet by mouth daily. Start taking on: January 17, 2023   doxycycline 100 MG tablet Commonly known as: VIBRA-TABS Take 1 tablet (100 mg total) by mouth every 12 (twelve) hours for 7 days.   metFORMIN 500 MG tablet Commonly known as: GLUCOPHAGE Take 1 tablet (500 mg total) by mouth daily with breakfast.   naproxen sodium 220 MG tablet Commonly known as: ALEVE Take 660 mg by mouth every 4 (four) hours as needed (pain).               Discharge Care Instructions  (From admission, onward)           Start     Ordered   01/16/23 0000  Discharge wound care:       Comments: See above   01/16/23 1124            Follow-up Information     Nadara Mustard, MD Follow up in 1 week(s).  Specialty: Orthopedic Surgery Contact information: [address] [place] [address] [phone]         PlotnikovGeorgina Quint, MD Follow up in 1 week(s).   Specialty: Internal Medicine Contact information: [address] [place] [address] [phone]                Discharge Exam: Ceasar Mons Weights   01/09/23 1706  Weight: 111.1 kg   General NAD  Condition at discharge: stable  The results of significant diagnostics from this hospitalization (including imaging, microbiology, ancillary and laboratory) are listed below for reference.   Imaging Studies: CT EXTREMITY LOWER LEFT W CONTRAST  Result Date: 01/12/2023 CLINICAL DATA:  Left lower leg infection with persistent swelling and erythema. History of  incision and drainage EXAM: CT OF THE LOWER LEFT EXTREMITY WITH CONTRAST TECHNIQUE: Multidetector CT imaging of the lower left extremity was performed according to the standard protocol following intravenous contrast administration. RADIATION DOSE REDUCTION: This exam was performed according to the departmental dose-optimization program which includes automated exposure control, adjustment of the mA and/or kV according to patient size and/or use of iterative reconstruction technique. CONTRAST:  75mL OMNIPAQUE IOHEXOL 350 MG/ML SOLN COMPARISON:  X-ray 01/09/2023 FINDINGS: Bones/Joint/Cartilage No acute fracture. No dislocation. No erosion or periosteal elevation. No tibiotalar or subtalar joint effusion. No lytic or sclerotic bone lesion. Ligaments Suboptimally assessed by CT. Muscles and Tendons Musculotendinous structures within normal limits by CT. No intramuscular fluid collection. No tenosynovial fluid collections. Soft tissues Partially rim enhancing fluid collection within the medial subcutaneous soft tissues of the lower leg at the level of the distal tibial diaphysis measuring approximately 5.0 x 1.0 x 5.0 cm (series 5, image 46). Collection appears to extend to the overlying skin surface which may be related to site of previous incision or reflect a draining sinus tract. There is overlying bandaging material at this location. There are a few foci of air within the collection. Generalized subcutaneous edema. No additional organized fluid collections. IMPRESSION: 1. Partially rim-enhancing fluid collection within the medial subcutaneous soft tissues of the lower leg at the level of the distal tibial diaphysis measuring approximately 5.0 x 1.0 x 5.0 cm. Collection appears to extend to the overlying skin surface which may be related to site of previous incision or reflect a draining sinus tract. There are a few foci of air within the collection. 2. Generalized subcutaneous edema. 3. No acute osseous  abnormality. Electronically Signed   By: Duanne Guess D.O.   On: 01/12/2023 11:40   VAS [place] LOWER EXTREMITY VENOUS (DVT)  Result Date: 01/10/2023  Lower Venous DVT Study Patient Name:  KOLTER BEACHUM  Date of Exam:   01/10/2023 Medical Rec #: [id number]         Accession #:    [id number] Date of Birth: 11/07/1970         Patient Gender: M Patient Age:   56 years Exam [facility]  [facility] Procedure:      VAS [place] LOWER EXTREMITY VENOUS (DVT) Referring Phys: Dolly Rias --------------------------------------------------------------------------------  Indications: Pain, Swelling, Erythema, and Abscess of left calf, status post I&D.  Comparison Study: No prior study on file Performing Technologist: Sherren Kerns RVS  Examination Guidelines: A complete evaluation includes B-mode imaging, spectral Doppler, color Doppler, and power Doppler as needed of all accessible portions of each vessel. Bilateral testing is considered an integral part of a complete examination. Limited examinations for reoccurring indications may be performed as noted. The reflux portion of the  exam is performed with the patient in reverse Trendelenburg.  +-----+---------------+---------+-----------+----------+--------------+ RIGHTCompressibilityPhasicitySpontaneityPropertiesThrombus Aging +-----+---------------+---------+-----------+----------+--------------+ CFV  Full           Yes      Yes                                 +-----+---------------+---------+-----------+----------+--------------+   +---------+---------------+---------+-----------+----------+--------------+ LEFT     CompressibilityPhasicitySpontaneityPropertiesThrombus Aging +---------+---------------+---------+-----------+----------+--------------+ CFV      Full           Yes      Yes                                 +---------+---------------+---------+-----------+----------+--------------+ SFJ      Full                                                         +---------+---------------+---------+-----------+----------+--------------+ FV Prox  Full                                                        +---------+---------------+---------+-----------+----------+--------------+ FV Mid   Full                                                        +---------+---------------+---------+-----------+----------+--------------+ FV DistalFull                                                        +---------+---------------+---------+-----------+----------+--------------+ PFV      Full                                                        +---------+---------------+---------+-----------+----------+--------------+ POP      Full           Yes      Yes                                 +---------+---------------+---------+-----------+----------+--------------+ PTV      Full                                                        +---------+---------------+---------+-----------+----------+--------------+ PERO     Full                                                        +---------+---------------+---------+-----------+----------+--------------+  Summary: RIGHT: - No evidence of common femoral vein obstruction.   LEFT: - No evidence of deep vein thrombosis in the lower extremity. No indirect evidence of obstruction proximal to the inguinal ligament.  - No cystic structure found in the popliteal fossa. - Ultrasound characteristics of enlarged lymph nodes noted in the groin.  *See table(s) above for measurements and observations. Electronically signed by Sherald Hess MD on 01/10/2023 at 5:52:36 PM.    Final    DG Tibia/Fibula Left  Result Date: 01/09/2023 CLINICAL DATA:  Injured left lower leg 1 week ago after hitting leg on a piece of piece of furniture. Leg is extremely red and tender to touch. EXAM: LEFT TIBIA AND FIBULA - 2 VIEW COMPARISON:  None Available. FINDINGS: No acute fracture or dislocation.  Soft tissue swelling about the ankle. Focal soft tissue thickening about the distal calf medially IMPRESSION: 1. No acute osseous abnormality. 2. Focal swelling about the medial left calf with more diffuse swelling about the ankle. Electronically Signed   By: Minerva Fester M.D.   On: 01/09/2023 23:57    Microbiology: Results for orders placed or performed during the hospital encounter of 01/09/23  Culture, blood (single)     Status: None   Collection Time: 01/09/23 11:06 PM   Specimen: BLOOD  Result Value Ref Range Status   Specimen Description BLOOD LEFT ANTECUBITAL  Final   Special Requests   Final    BOTTLES DRAWN AEROBIC AND ANAEROBIC Blood Culture results may not be optimal due to an excessive volume of blood received in culture bottles   Culture   Final    NO GROWTH 5 DAYS Performed at [facility], [address] [place], [place] [address]    Report Status 01/14/2023 FINAL  Final  Body fluid culture w Gram Stain     Status: Abnormal   Collection Time: 01/10/23  1:04 AM   Specimen: Abscess; Body Fluid  Result Value Ref Range Status   Specimen Description ABSCESS  Final   Special Requests LEFT LOWER LEG  Final   Gram Stain   Final    ABUNDANT WBC PRESENT,BOTH PMN AND MONONUCLEAR FEW GRAM POSITIVE COCCI IN CLUSTERS Performed at [facility], [address] [place], [place] [address]    Culture STAPHYLOCOCCUS AUREUS (A)  Final   Report Status 01/12/2023 FINAL  Final   Organism ID, Bacteria STAPHYLOCOCCUS AUREUS  Final      Susceptibility   Staphylococcus aureus - MIC*    CIPROFLOXACIN <=0.5 SENSITIVE Sensitive     ERYTHROMYCIN <=0.25 SENSITIVE Sensitive     GENTAMICIN <=0.5 SENSITIVE Sensitive     OXACILLIN <=0.25 SENSITIVE Sensitive     TETRACYCLINE <=1 SENSITIVE Sensitive     VANCOMYCIN 1 SENSITIVE Sensitive     TRIMETH/SULFA <=10 SENSITIVE Sensitive     CLINDAMYCIN <=0.25 SENSITIVE Sensitive     RIFAMPIN <=0.5 SENSITIVE Sensitive     Inducible  Clindamycin NEGATIVE Sensitive     LINEZOLID 2 SENSITIVE Sensitive     * STAPHYLOCOCCUS AUREUS    Labs: CBC: Recent Labs  Lab 01/09/23 1715 01/10/23 0222 01/11/23 0630 01/12/23 0851 01/13/23 0540 01/14/23 1202  WBC 8.6 9.2 7.4 8.1 8.2 6.6  NEUTROABS 5.4  --  3.8 4.7 4.5 4.0  HGB 13.3 13.0 12.8* 13.1 12.7* 13.4  HCT 41.4 40.1 39.0 38.9* 39.2 40.0  MCV 91.4 92.8 91.8 92.0 90.5 90.3  PLT 299 282 291 327 341 360   Basic Metabolic Panel: Recent Labs  Lab 01/09/23 1715 01/11/23 0630 01/12/23 0851 01/13/23 0540 01/14/23 0951  NA 136 137 137 137 135  K 3.4* 4.2 4.1 4.1 3.7  CL 102 108 104 103 104  CO2 24 24 26 25 22   GLUCOSE 98 129* 126* 125* 117*  BUN 12 14 9 12 14   CREATININE 1.10 1.23 1.19 1.24 1.23  CALCIUM 8.6* 8.3* 8.7* 8.8* 9.0   Liver Function Tests: No results for input(s): "AST", "ALT", "ALKPHOS", "BILITOT", "PROT", "ALBUMIN" in the last 168 hours. CBG: Recent Labs  Lab 01/15/23 1124 01/15/23 1209 01/15/23 1613 01/15/23 2016 01/16/23 0624  GLUCAP 101* 132* 102* 155* 130*    Discharge time spent: greater than 30 minutes.  Signed: Alba Cory, MD [facility] 01/16/2023

## 2023-01-22 ENCOUNTER — Other Ambulatory Visit: Payer: Self-pay

## 2023-01-22 ENCOUNTER — Telehealth: Payer: Self-pay | Admitting: Orthopedic Surgery

## 2023-01-22 NOTE — Telephone Encounter (Signed)
Patient called and said that he was supposed to make an appt for tomorrow but Lajoyce Corners is doing surgery so he made an appt for October 2. His note for work is only Friday and he will need another one til the 2nd of October.CB#[phone]

## 2023-01-22 NOTE — Telephone Encounter (Signed)
I called pt to advise note at front desk. Pt was consulted in the hospital and advised to follow up out patient. His appt is on 01/28/2023. Letter states out until that time and then will discuss return to work options.

## 2023-01-24 ENCOUNTER — Other Ambulatory Visit (HOSPITAL_COMMUNITY): Payer: Self-pay

## 2023-01-27 ENCOUNTER — Ambulatory Visit: Payer: Commercial Managed Care - PPO | Admitting: Orthopedic Surgery

## 2023-01-27 DIAGNOSIS — L03116 Cellulitis of left lower limb: Secondary | ICD-10-CM

## 2023-01-27 DIAGNOSIS — L02416 Cutaneous abscess of left lower limb: Secondary | ICD-10-CM

## 2023-01-27 MED ORDER — AMOXICILLIN-POT CLAVULANATE 875-125 MG PO TABS: 1.0000 | ORAL_TABLET | Freq: Two times a day (BID) | ORAL | 0 refills | Status: AC

## 2023-01-28 ENCOUNTER — Ambulatory Visit: Payer: Commercial Managed Care - PPO | Admitting: Orthopedic Surgery

## 2023-01-28 ENCOUNTER — Encounter: Payer: Self-pay | Admitting: Orthopedic Surgery

## 2023-01-28 NOTE — Progress Notes (Signed)
Office Visit Note   Patient: Luis Johns           Date of Birth: 07-12-1970           MRN: [id number] Visit Date: 01/27/2023              Requested by: Tresa Garter, MD [address] [place],  [place] [address] PCP: Plotnikov, Georgina Quint, MD  Chief Complaint  Patient presents with   Left Leg - Wound Check      HPI: Patient is a 52 year old gentleman who is seen for initial evaluation in the office for abscess left leg.  Patient was initially seen in the emergency department and was discharged on doxycycline.  Patient states his prescription ran out Friday.  Initial cultures on September 14 showed Staph aureus.  Assessment & Plan: Visit Diagnoses:  1. Cellulitis and abscess of left leg     Plan: Patient underwent repeat debridement of the left calf abscess.  He is given a prescription for Augmentin.  He will proceed with Dial soap cleansing dry dressing changes.  Follow-Up Instructions: Return in about 1 week (around 02/03/2023).   Ortho Exam  Patient is alert, oriented, no adenopathy, well-dressed, normal affect, normal respiratory effort. Examination patient has venous and lymphatic swelling of the left lower extremity.  There is an area of fluctuance over the medial aspect of the left calf.  After informed consent the area was prepped using Betadine paint locally anesthetized with 1 cc of 1% lidocaine plain.  A incision was made with a 10 blade knife and there was a purulent abscess that was drained.  This was sent for cultures.  The wound is 1 cm in diameter and probes 3 cm of undermining area of abscess.  This was packed open.  Imaging: No results found. No images are attached to the encounter.  Labs: Lab Results  Component Value Date   HGBA1C 6.6 (H) 01/12/2023   HGBA1C 6.9 (H) 01/11/2023   REPTSTATUS 01/12/2023 FINAL 01/10/2023   GRAMSTAIN  01/10/2023    ABUNDANT WBC PRESENT,BOTH PMN AND MONONUCLEAR FEW GRAM POSITIVE COCCI IN CLUSTERS Performed at  [facility], [address], [place], [place] [address]    CULT STAPHYLOCOCCUS AUREUS (A) 01/10/2023   LABORGA STAPHYLOCOCCUS AUREUS 01/10/2023     Lab Results  Component Value Date   ALBUMIN 4.1 01/25/2015   ALBUMIN 4.4 08/19/2014    No results found for: "MG" Lab Results  Component Value Date   VD25OH 17.88 (L) 01/25/2015    No results found for: "PREALBUMIN"    Latest Ref Rng & Units 01/14/2023   12:02 PM 01/13/2023    5:40 AM 01/12/2023    8:51 AM  CBC EXTENDED  WBC 4.0 - 10.5 K/uL 6.6  8.2  8.1   RBC 4.22 - 5.81 MIL/uL 4.43  4.33  4.23   Hemoglobin 13.0 - 17.0 g/dL 13.0  86.5  78.4   HCT 39.0 - 52.0 % 40.0  39.2  38.9   Platelets 150 - 400 K/uL 360  341  327   NEUT# 1.7 - 7.7 K/uL 4.0  4.5  4.7   Lymph# 0.7 - 4.0 K/uL 1.7  2.3  2.0      There is no height or weight on file to calculate BMI.  Orders:  Orders Placed This Encounter  Procedures   Anaerobic and Aerobic Culture   Meds ordered this encounter  Medications   amoxicillin-clavulanate (AUGMENTIN) 875-125 MG tablet  Sig: Take 1 tablet by mouth 2 (two) times daily.    Dispense:  20 tablet    Refill:  0     Procedures: No procedures performed  Clinical Data: No additional findings.  ROS:  All other systems negative, except as noted in the HPI. Review of Systems  Objective: Vital Signs: There were no vitals taken for this visit.  Specialty Comments:  No specialty comments available.  PMFS History: Patient Active Problem List   Diagnosis Date Noted   Cellulitis and abscess of left leg 01/10/2023   Hypokalemia 01/10/2023   GERD (gastroesophageal reflux disease) 05/03/2015   Wheezing 01/25/2015   Well adult exam 01/25/2015   Allergic rhinitis 01/25/2015   History reviewed. No pertinent past medical history.  Family History  Problem Relation Age of Onset   Stroke Brother    Heart disease Brother    Healthy Mother     History reviewed. No pertinent surgical history. Social  History   Occupational History   Not on file  Tobacco Use   Smoking status: Never   Smokeless tobacco: Never  Vaping Use   Vaping status: Never Used  Substance and Sexual Activity   Alcohol use: Yes    Comment: very little   Drug use: No   Sexual activity: Yes

## 2023-02-02 LAB — ANAEROBIC AND AEROBIC CULTURE
AER RESULT:: NO GROWTH
MICRO NUMBER:: 15536459
MICRO NUMBER:: 15536460
SPECIMEN QUALITY:: ADEQUATE
SPECIMEN QUALITY:: ADEQUATE

## 2023-02-05 ENCOUNTER — Ambulatory Visit: Payer: Commercial Managed Care - PPO | Admitting: Orthopedic Surgery

## 2023-02-05 DIAGNOSIS — L03116 Cellulitis of left lower limb: Secondary | ICD-10-CM

## 2023-02-05 DIAGNOSIS — L02416 Cutaneous abscess of left lower limb: Secondary | ICD-10-CM

## 2023-02-09 ENCOUNTER — Encounter: Payer: Self-pay | Admitting: Orthopedic Surgery

## 2023-02-09 NOTE — Progress Notes (Signed)
Office Visit Note   Patient: Luis Johns           Date of Birth: 03-30-1971           MRN: [id number] Visit Date: 02/05/2023              Requested by: Tresa Garter, MD [address] [place],  [place] [address] PCP: Tresa Garter, MD  Chief Complaint  Patient presents with   Left Leg - Wound Check      HPI: Patient is a 52 year old gentleman who is status post debridement calf abscess in the office on October 1 he has been on Augmentin.  Assessment & Plan: Visit Diagnoses:  1. Cellulitis and abscess of left leg     Plan: The area of infection has resolved.  He will follow-up as needed.  Follow-Up Instructions: Return if symptoms worsen or fail to improve.   Ortho Exam  Patient is alert, oriented, no adenopathy, well-dressed, normal affect, normal respiratory effort. Examination there is no tenderness to palpation no fluctuance I cannot express any fluid.  There is no cellulitis.  Cultures are negative.  Imaging: No results found. No images are attached to the encounter.  Labs: Lab Results  Component Value Date   HGBA1C 6.6 (H) 01/12/2023   HGBA1C 6.9 (H) 01/11/2023   REPTSTATUS 01/12/2023 FINAL 01/10/2023   GRAMSTAIN  01/10/2023    ABUNDANT WBC PRESENT,BOTH PMN AND MONONUCLEAR FEW GRAM POSITIVE COCCI IN CLUSTERS Performed at [facility], [address] [place], [place] [address]    CULT STAPHYLOCOCCUS AUREUS (A) 01/10/2023   LABORGA STAPHYLOCOCCUS AUREUS 01/10/2023     Lab Results  Component Value Date   ALBUMIN 4.1 01/25/2015   ALBUMIN 4.4 08/19/2014    No results found for: "MG" Lab Results  Component Value Date   VD25OH 17.88 (L) 01/25/2015    No results found for: "PREALBUMIN"    Latest Ref Rng & Units 01/14/2023   12:02 PM 01/13/2023    5:40 AM 01/12/2023    8:51 AM  CBC EXTENDED  WBC 4.0 - 10.5 K/uL 6.6  8.2  8.1   RBC 4.22 - 5.81 MIL/uL 4.43  4.33  4.23   Hemoglobin 13.0 - 17.0 g/dL 13.2  44.0  10.2   HCT  39.0 - 52.0 % 40.0  39.2  38.9   Platelets 150 - 400 K/uL 360  341  327   NEUT# 1.7 - 7.7 K/uL 4.0  4.5  4.7   Lymph# 0.7 - 4.0 K/uL 1.7  2.3  2.0      There is no height or weight on file to calculate BMI.  Orders:  No orders of the defined types were placed in this encounter.  No orders of the defined types were placed in this encounter.    Procedures: No procedures performed  Clinical Data: No additional findings.  ROS:  All other systems negative, except as noted in the HPI. Review of Systems  Objective: Vital Signs: There were no vitals taken for this visit.  Specialty Comments:  No specialty comments available.  PMFS History: Patient Active Problem List   Diagnosis Date Noted   Cellulitis and abscess of left leg 01/10/2023   Hypokalemia 01/10/2023   GERD (gastroesophageal reflux disease) 05/03/2015   Wheezing 01/25/2015   Well adult exam 01/25/2015   Allergic rhinitis 01/25/2015   History reviewed. No pertinent past medical history.  Family History  Problem Relation Age of Onset   Stroke  Brother    Heart disease Brother    Healthy Mother     History reviewed. No pertinent surgical history. Social History   Occupational History   Not on file  Tobacco Use   Smoking status: Never   Smokeless tobacco: Never  Vaping Use   Vaping status: Never Used  Substance and Sexual Activity   Alcohol use: Yes    Comment: very little   Drug use: No   Sexual activity: Yes

## 2024-02-05 ENCOUNTER — Ambulatory Visit: Payer: Self-pay

## 2024-02-05 NOTE — Telephone Encounter (Signed)
 FYI Only or Action Required?: Action required by provider: Asking for New patient exception as multiple family members see Dr Garald for years.  Patient was last seen in primary care on Dr Plotnikov2017.  Called Nurse Triage reporting Leg Swelling.  Symptoms began a week ago.  Interventions attempted: Rest, hydration, or home remedies.  Symptoms are: completely resolved.  Triage Disposition: See PCP Within 2 Weeks  Patient/caregiver understands and will follow disposition?: No, wishes to speak with PCP Copied from CRM [id number]. Topic: Clinical - Red Word Triage >> Feb 05, 2024 12:49 PM Harlene ORN wrote: Red Word that prompted transfer to Nurse Triage: swelling his leg for a week Reason for Disposition  [1] MILD swelling of both ankles (e.g., mild pedal edema) AND [2] caused by hot weather  Answer Assessment - Initial Assessment Questions Mom calling to make physical appt for pt. Last seen by Dr Garald in 2017. Dr SHAUNNA follows pts entire family and looking to get re-established. Understands not accepting new patients but would like consideration since the family only sees Dr SHAUNNA.  Last week had bilateral foot and ankle swelling. Pt informed mom it has since resolved. He works for city of [employer] and drives a lot. His diet is very salty. Denies fever, redness, or pain with the swelling. Mom said it looked like he had been sitting for a long period of time and feel swole.   Advised being seen by another provider in the office sooner and Mom would like pt to still see Dr Garald. Is going out of town on a cruise 11/8-11/15 and is off on Thursdays and Fridays ideally for appt. Can call mom if pt does not answer as his work is strict.   1. ONSET: When did the swelling start? (e.g., minutes, hours, days)     Last week  2. LOCATION: What part of the leg is swollen?  Are both legs swollen or just one leg?     Swelling in both ankles and feet  3. SEVERITY: How bad is the swelling?  (e.g., localized; mild, moderate, severe)     Feet and ankles almost twice the size  4. REDNESS: Is there redness or signs of infection?     denies 5. PAIN: Is the swelling painful to touch? If Yes, ask: How painful is it?   (Scale 1-10; mild, moderate or severe)     denies 6. FEVER: Do you have a fever? If Yes, ask: What is it, how was it measured, and when did it start?      denies 7. CAUSE: What do you think is causing the leg swelling?     Mom thinks dependent edema- he drives a lot for work  8. MEDICAL HISTORY: Do you have a history of blood clots (e.g., DVT), cancer, heart failure, kidney disease, or liver failure?     Denies  9. RECURRENT SYMPTOM: Have you had leg swelling before? If Yes, ask: When was the last time? What happened that time?     Has never had this before 10. OTHER SYMPTOMS: Do you have any other symptoms? (e.g., chest pain, difficulty breathing)       Denies  Answer Assessment - Initial Assessment Questions Mom calling to make physical appt for pt. Last seen by Dr Garald in 2017. Dr SHAUNNA follows pts entire family and looking to get re-established. Understands not accepting new patients but would like consideration since the family only sees Dr SHAUNNA.  Last week had bilateral foot and ankle swelling.  Pt informed mom it has since resolved. He works for [employer] and drives a lot. His diet is very salty. Denies fever, SOB, CP, redness, or pain with the swelling. Mom said it looked like he had been sitting for a long period of time and feel swole.   Advised being seen by another provider in the office sooner and Mom would like pt to still see Dr Garald. Is going out of town on a cruise 11/8-11/15 and is off on Thursdays and Fridays ideally for appt. Can call mom if pt does not answer as his work is strict.   1. LOCATION: Which ankle is swollen? Where is the swelling?     Bilaeral feet and ankle 2. ONSET: When did the swelling start?      Last week 3. SWELLING: How bad is the swelling? Or, How large is it? (e.g., mild, moderate, severe; size of localized swelling)      Still able to wear shoes but tight 4. PAIN: Is there any pain? If Yes, ask: How bad is it? (Scale 0-10; or none, mild, moderate, severe)     denies 5. CAUSE: What do you think caused the ankle swelling?     Mom concerned for dependent edema and high salt diet- doesn't maintain PCP 6. OTHER SYMPTOMS: Do you have any other symptoms? (e.g., fever, chest pain, difficulty breathing, calf pain)     denies  Protocols used: Leg Swelling and Edema-A-AH, Ankle Swelling-A-AH

## 2024-02-08 NOTE — Telephone Encounter (Signed)
 Okay to schedule office visit/new patient with me.  Thanks

## 2024-02-26 NOTE — Telephone Encounter (Signed)
 Called and informed patient of approval for re-establishment. Patient expressed understanding and is scheduled for mid November

## 2024-03-15 ENCOUNTER — Ambulatory Visit: Payer: Self-pay | Admitting: Internal Medicine

## 2024-03-15 ENCOUNTER — Ambulatory Visit: Admitting: Internal Medicine

## 2024-03-15 ENCOUNTER — Encounter: Payer: Self-pay | Admitting: Internal Medicine

## 2024-03-15 VITALS — BP 124/74 | HR 60 | Temp 98.0°F | Ht 70.0 in | Wt 275.0 lb

## 2024-03-15 DIAGNOSIS — R739 Hyperglycemia, unspecified: Secondary | ICD-10-CM | POA: Insufficient documentation

## 2024-03-15 DIAGNOSIS — E119 Type 2 diabetes mellitus without complications: Secondary | ICD-10-CM

## 2024-03-15 DIAGNOSIS — Z0000 Encounter for general adult medical examination without abnormal findings: Secondary | ICD-10-CM

## 2024-03-15 DIAGNOSIS — I872 Venous insufficiency (chronic) (peripheral): Secondary | ICD-10-CM

## 2024-03-15 DIAGNOSIS — Z1211 Encounter for screening for malignant neoplasm of colon: Secondary | ICD-10-CM

## 2024-03-15 DIAGNOSIS — L02416 Cutaneous abscess of left lower limb: Secondary | ICD-10-CM

## 2024-03-15 DIAGNOSIS — M2141 Flat foot [pes planus] (acquired), right foot: Secondary | ICD-10-CM | POA: Diagnosis not present

## 2024-03-15 DIAGNOSIS — L03116 Cellulitis of left lower limb: Secondary | ICD-10-CM

## 2024-03-15 DIAGNOSIS — E669 Obesity, unspecified: Secondary | ICD-10-CM

## 2024-03-15 DIAGNOSIS — M2142 Flat foot [pes planus] (acquired), left foot: Secondary | ICD-10-CM

## 2024-03-15 DIAGNOSIS — Z7985 Long-term (current) use of injectable non-insulin antidiabetic drugs: Secondary | ICD-10-CM

## 2024-03-15 LAB — CBC WITH DIFFERENTIAL/PLATELET
Basophils Absolute: 0 K/uL (ref 0.0–0.1)
Basophils Relative: 0.6 % (ref 0.0–3.0)
Eosinophils Absolute: 0.2 K/uL (ref 0.0–0.7)
Eosinophils Relative: 4.1 % (ref 0.0–5.0)
HCT: 43.2 % (ref 39.0–52.0)
Hemoglobin: 14.4 g/dL (ref 13.0–17.0)
Lymphocytes Relative: 32.5 % (ref 12.0–46.0)
Lymphs Abs: 1.5 K/uL (ref 0.7–4.0)
MCHC: 33.4 g/dL (ref 30.0–36.0)
MCV: 90.1 fl (ref 78.0–100.0)
Monocytes Absolute: 0.4 K/uL (ref 0.1–1.0)
Monocytes Relative: 9.2 % (ref 3.0–12.0)
Neutro Abs: 2.5 K/uL (ref 1.4–7.7)
Neutrophils Relative %: 53.6 % (ref 43.0–77.0)
Platelets: 253 K/uL (ref 150.0–400.0)
RBC: 4.8 Mil/uL (ref 4.22–5.81)
RDW: 14.1 % (ref 11.5–15.5)
WBC: 4.6 K/uL (ref 4.0–10.5)

## 2024-03-15 LAB — COMPREHENSIVE METABOLIC PANEL WITH GFR
ALT: 22 U/L (ref 0–53)
AST: 17 U/L (ref 0–37)
Albumin: 4 g/dL (ref 3.5–5.2)
Alkaline Phosphatase: 58 U/L (ref 39–117)
BUN: 14 mg/dL (ref 6–23)
CO2: 29 meq/L (ref 19–32)
Calcium: 9.3 mg/dL (ref 8.4–10.5)
Chloride: 103 meq/L (ref 96–112)
Creatinine, Ser: 1.15 mg/dL (ref 0.40–1.50)
GFR: 72.66 mL/min (ref >60.00)
Glucose, Bld: 118 mg/dL — ABNORMAL HIGH (ref 70–99)
Potassium: 4.3 meq/L (ref 3.5–5.1)
Sodium: 140 meq/L (ref 135–145)
Total Bilirubin: 0.8 mg/dL (ref 0.2–1.2)
Total Protein: 7.3 g/dL (ref 6.0–8.3)

## 2024-03-15 LAB — URINALYSIS, ROUTINE W REFLEX MICROSCOPIC
Bilirubin Urine: NEGATIVE
Ketones, ur: NEGATIVE
Leukocytes,Ua: NEGATIVE
Nitrite: NEGATIVE
Specific Gravity, Urine: 1.015 (ref 1.000–1.030)
Total Protein, Urine: NEGATIVE
Urine Glucose: NEGATIVE
Urobilinogen, UA: 2 — AB (ref 0.0–1.0)
pH: 7 (ref 5.0–8.0)

## 2024-03-15 LAB — PSA: PSA: 0.71 ng/mL (ref 0.10–4.00)

## 2024-03-15 LAB — LIPID PANEL
Cholesterol: 196 mg/dL (ref 0–200)
HDL: 52.3 mg/dL (ref >39.00)
LDL Cholesterol: 130 mg/dL — ABNORMAL HIGH (ref 0–99)
NonHDL: 143.61
Total CHOL/HDL Ratio: 4
Triglycerides: 68 mg/dL (ref 0.0–149.0)
VLDL: 13.6 mg/dL (ref 0.0–40.0)

## 2024-03-15 LAB — HEMOGLOBIN A1C: Hgb A1c MFr Bld: 6.6 % — ABNORMAL HIGH (ref 4.6–6.5)

## 2024-03-15 LAB — VITAMIN D 25 HYDROXY (VIT D DEFICIENCY, FRACTURES): VITD: 14.44 ng/mL — ABNORMAL LOW (ref 30.00–100.00)

## 2024-03-15 LAB — TSH: TSH: 0.99 u[IU]/mL (ref 0.35–5.50)

## 2024-03-15 MED ORDER — VITAMIN D3 50 MCG (2000 UT) PO CAPS
2000.0000 [IU] | ORAL_CAPSULE | Freq: Every day | ORAL | 3 refills | Status: AC
  Filled 2024-03-15: qty 100, 100d supply, fill #0

## 2024-03-15 MED ORDER — TIRZEPATIDE 2.5 MG/0.5ML ~~LOC~~ SOAJ
2.5000 mg | SUBCUTANEOUS | 3 refills | Status: AC
  Filled 2024-03-15 – 2024-03-17 (×2): qty 2, 28d supply, fill #0

## 2024-03-15 MED ORDER — VITAMIN D (ERGOCALCIFEROL) 1.25 MG (50000 UNIT) PO CAPS
50000.0000 [IU] | ORAL_CAPSULE | ORAL | 0 refills | Status: AC
  Filled 2024-03-15: qty 8, 56d supply, fill #0

## 2024-03-15 NOTE — Assessment & Plan Note (Signed)
 Probable DM

## 2024-03-15 NOTE — Assessment & Plan Note (Signed)
 Try HOKAs

## 2024-03-15 NOTE — Assessment & Plan Note (Signed)

## 2024-03-15 NOTE — Progress Notes (Unsigned)
 Subjective:  Patient ID: Luis Johns, male    DOB: Oct 16, 1970  Age: 53 y.o. MRN: [id number]  CC: Medical Management of Chronic Issues (Re-establishing care)   HPI Luis Johns presents for a new pt OV - to re-establish primary. 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 (Patient taking differently: Inhale 2 puffs into the lungs every 6 (six) hours as needed for wheezing or shortness of breath.) 18 g 0   Multiple Vitamin (MULTIVITAMIN WITH MINERALS) TABS tablet Take 1 tablet by mouth daily. 130 tablet 0   naproxen sodium (ALEVE) 220 MG tablet Take 660 mg by mouth every 4 (four) hours as needed (pain).     amoxicillin -clavulanate (AUGMENTIN ) 875-125 MG tablet Take 1 tablet by mouth 2 (two) times daily. (Patient not taking: Reported on 03/15/2024) 20 tablet 0   metFORMIN  (GLUCOPHAGE ) 500 MG tablet Take 1 tablet (500 mg total) by mouth daily with breakfast. (Patient not taking: Reported on 03/15/2024) 30 tablet 0   No facility-administered medications prior to visit.    ROS: Review of Systems  Constitutional:  Negative for appetite change, fatigue and unexpected weight change.  HENT:  Negative for congestion, nosebleeds, sneezing, sore throat and trouble swallowing.   Eyes:  Negative for itching and visual disturbance.  Respiratory:  Negative for cough.   Cardiovascular:  Negative for chest pain, palpitations and leg swelling.  Gastrointestinal:  Negative for abdominal distention, blood in stool, diarrhea and nausea.  Genitourinary:  Negative for frequency and hematuria.  Musculoskeletal:  Negative for arthralgias, back pain, gait problem, joint swelling and neck pain.  Skin:  Positive for  color change. Negative for rash.  Neurological:  Negative for dizziness, tremors, speech difficulty and weakness.  Psychiatric/Behavioral:  Negative for agitation, dysphoric mood and sleep disturbance. The patient is not nervous/anxious.     Objective:  BP 124/74   Pulse 60   Temp 98 F (36.7 C)   Ht 5' 10 (1.778 m)   Wt 275 lb (124.7 kg)   SpO2 97%   BMI 39.46 kg/m   BP Readings from Last 3 Encounters:  03/15/24 124/74  01/16/23 128/83  01/09/23 123/82    Wt Readings from Last 3 Encounters:  03/15/24 275 lb (124.7 kg)  01/09/23 244 lb 14.9 oz (111.1 kg)  01/09/23 245 lb (111.1 kg)    Physical Exam Constitutional:      General: He is not in acute distress.    Appearance: He is well-developed. He is obese.     Comments: NAD  Eyes:     Conjunctiva/sclera: Conjunctivae normal.     Pupils: Pupils are equal, round, and reactive to light.  Neck:     Thyroid : No thyromegaly.     Vascular: No JVD.  Cardiovascular:     Rate and Rhythm: Normal rate and regular rhythm.     Heart sounds: Normal heart sounds. No murmur heard.    No friction rub. No gallop.  Pulmonary:     Effort: Pulmonary effort is normal. No respiratory distress.     Breath sounds: Normal breath sounds. No wheezing or rales.  Chest:     Chest wall: No tenderness.  Abdominal:  General: Bowel sounds are normal. There is no distension.     Palpations: Abdomen is soft. There is no mass.     Tenderness: There is no abdominal tenderness. There is no guarding or rebound.  Musculoskeletal:        General: No tenderness. Normal range of motion.     Cervical back: Normal range of motion.     Right lower leg: No edema.     Left lower leg: Edema present.  Lymphadenopathy:     Cervical: No cervical adenopathy.  Skin:    General: Skin is warm and dry.     Findings: No rash.  Neurological:     Mental Status: He is alert and oriented to person, place, and time.     Cranial Nerves: No cranial nerve deficit.      Motor: No abnormal muscle tone.     Coordination: Coordination normal.     Gait: Gait normal.     Deep Tendon Reflexes: Reflexes are normal and symmetric.  Psychiatric:        Behavior: Behavior normal.        Thought Content: Thought content normal.        Judgment: Judgment normal.   Rectal exam-deferred Hyperpigmentation and swelling of the distal shin, left leg Lab Results  Component Value Date   WBC 4.6 03/15/2024   HGB 14.4 03/15/2024   HCT 43.2 03/15/2024   PLT 253.0 03/15/2024   GLUCOSE 118 (H) 03/15/2024   CHOL 196 03/15/2024   TRIG 68.0 03/15/2024   HDL 52.30 03/15/2024   LDLCALC 130 (H) 03/15/2024   ALT 22 03/15/2024   AST 17 03/15/2024   NA 140 03/15/2024   K 4.3 03/15/2024   CL 103 03/15/2024   CREATININE 1.15 03/15/2024   BUN 14 03/15/2024   CO2 29 03/15/2024   TSH 0.99 03/15/2024   PSA 0.71 03/15/2024   HGBA1C 6.6 (H) 03/15/2024    VAS US  LOWER EXTREMITY VENOUS (DVT) Result Date: 01/10/2023  Lower Venous DVT Study Patient Name:  Luis Johns  Date of Exam:   01/10/2023 Medical Rec #: [id number]         Accession #:    [id number] Date of Birth: [birth year]         Patient Gender: M Patient Age:   66 years Exam Location:  [facility] Procedure:      VAS US  LOWER EXTREMITY VENOUS (DVT) Referring Phys: DORN DAWSON --------------------------------------------------------------------------------  Indications: Pain, Swelling, Erythema, and Abscess of left calf, status post I&D.  Comparison Study: No prior study on file Performing Technologist: Alberta Lis RVS  Examination Guidelines: A complete evaluation includes B-mode imaging, spectral Doppler, color Doppler, and power Doppler as needed of all accessible portions of each vessel. Bilateral testing is considered an integral part of a complete examination. Limited examinations for reoccurring indications may be performed as noted. The reflux portion of the exam is performed with the patient in reverse  Trendelenburg.  +-----+---------------+---------+-----------+----------+--------------+ RIGHTCompressibilityPhasicitySpontaneityPropertiesThrombus Aging +-----+---------------+---------+-----------+----------+--------------+ CFV  Full           Yes      Yes                                 +-----+---------------+---------+-----------+----------+--------------+   +---------+---------------+---------+-----------+----------+--------------+ LEFT     CompressibilityPhasicitySpontaneityPropertiesThrombus Aging +---------+---------------+---------+-----------+----------+--------------+ CFV      Full           Yes  Yes                                 +---------+---------------+---------+-----------+----------+--------------+ SFJ      Full                                                        +---------+---------------+---------+-----------+----------+--------------+ FV Prox  Full                                                        +---------+---------------+---------+-----------+----------+--------------+ FV Mid   Full                                                        +---------+---------------+---------+-----------+----------+--------------+ FV DistalFull                                                        +---------+---------------+---------+-----------+----------+--------------+ PFV      Full                                                        +---------+---------------+---------+-----------+----------+--------------+ POP      Full           Yes      Yes                                 +---------+---------------+---------+-----------+----------+--------------+ PTV      Full                                                        +---------+---------------+---------+-----------+----------+--------------+ PERO     Full                                                         +---------+---------------+---------+-----------+----------+--------------+     Summary: RIGHT: - No evidence of common femoral vein obstruction.   LEFT: - No evidence of deep vein thrombosis in the lower extremity. No indirect evidence of obstruction proximal to the inguinal ligament.  - No cystic structure found in the popliteal fossa. - Ultrasound characteristics of enlarged lymph nodes noted in the groin.  *See table(s) above for measurements and observations. Electronically signed by Lonni Gaskins MD on 01/10/2023 at 5:52:36 PM.  Final    DG Tibia/Fibula Left Result Date: 01/09/2023 CLINICAL DATA:  Injured left lower leg 1 week ago after hitting leg on a piece of piece of furniture. Leg is extremely red and tender to touch. EXAM: LEFT TIBIA AND FIBULA - 2 VIEW COMPARISON:  None Available. FINDINGS: No acute fracture or dislocation. Soft tissue swelling about the ankle. Focal soft tissue thickening about the distal calf medially IMPRESSION: 1. No acute osseous abnormality. 2. Focal swelling about the medial left calf with more diffuse swelling about the ankle. Electronically Signed   By: Norman Gatlin M.D.   On: 01/09/2023 23:57    Assessment & Plan:   Problem List Items Addressed This Visit     Cellulitis and abscess of left leg   Postinflammatory changes on the distal shin.  Weight loss should help.  Compression socks      Chronic venous insufficiency   L>>R post infection      Relevant Orders   VITAMIN D  25 Hydroxy (Vit-D Deficiency, Fractures) (Completed)   Flat feet, bilateral   Try HOKAs      Hyperglycemia   Probable DM      Relevant Orders   Hemoglobin A1c (Completed)   Obesity (BMI 30-39.9)   BMI 39; weight 275 pounds Started on Mounjaro for diabete      Type 2 diabetes mellitus in patient with obesity (HCC)   A1c 6.6% BMI 39.46 weight 275 pounds Start Mounjaro 2.5 mg weekly.  Risks and benefits discussed Improve diet      Well adult exam - Primary    We  discussed age appropriate health related issues, including available/recomended screening tests and vaccinations. Labs were ordered to be later reviewed . All questions were answered. We discussed one or more of the following - seat belt use, use of sunscreen/sun exposure exercise, fall risk reduction, second hand smoke exposure, firearm use and storage, seat belt use, a need for adhering to healthy diet and exercise. Labs were ordered.  All questions were answered.      Relevant Orders   TSH (Completed)   Urinalysis   CBC with Differential/Platelet (Completed)   Lipid panel (Completed)   PSA (Completed)   Comprehensive metabolic panel with GFR (Completed)   Cologuard   VITAMIN D  25 Hydroxy (Vit-D Deficiency, Fractures) (Completed)   Hemoglobin A1c (Completed)   Other Visit Diagnoses       Screening for colon cancer       Relevant Orders   Cologuard         No orders of the defined types were placed in this encounter.     Follow-up: Return in about 3 months (around 06/15/2024) for a follow-up visit.  Marolyn Noel, MD

## 2024-03-15 NOTE — Patient Instructions (Addendum)
 Try HOKAs Compression socks

## 2024-03-15 NOTE — Assessment & Plan Note (Signed)
 L>>R post infection

## 2024-03-16 ENCOUNTER — Other Ambulatory Visit (HOSPITAL_COMMUNITY): Payer: Self-pay

## 2024-03-16 ENCOUNTER — Telehealth (HOSPITAL_COMMUNITY): Payer: Self-pay

## 2024-03-16 ENCOUNTER — Other Ambulatory Visit: Payer: Self-pay

## 2024-03-16 DIAGNOSIS — E669 Obesity, unspecified: Secondary | ICD-10-CM | POA: Insufficient documentation

## 2024-03-16 NOTE — Telephone Encounter (Signed)
 Pharmacy Patient Advocate Encounter   Received notification from Pt Calls Messages that prior authorization for Mounjaro 2.5MG /0.5ML auto-injectors  is required/requested.   Insurance verification completed.   The patient is insured through [facility].   Per test claim: PA required; PA submitted to above mentioned insurance via Latent Key/confirmation #/EOC [id number] Status is pending

## 2024-03-16 NOTE — Assessment & Plan Note (Signed)
 A1c 6.6% BMI 39.46 weight 275 pounds Start Mounjaro 2.5 mg weekly.  Risks and benefits discussed Improve diet

## 2024-03-16 NOTE — Telephone Encounter (Signed)
 PA request has been Received. New Encounter has been or will be created for follow up. For additional info see Pharmacy Prior Auth telephone encounter from 03/16/24.

## 2024-03-16 NOTE — Assessment & Plan Note (Signed)
 Postinflammatory changes on the distal shin.  Weight loss should help.  Compression socks

## 2024-03-16 NOTE — Assessment & Plan Note (Signed)
 BMI 39; weight 275 pounds Started on Mounjaro for diabete

## 2024-03-17 ENCOUNTER — Other Ambulatory Visit (HOSPITAL_COMMUNITY): Payer: Self-pay

## 2024-03-17 NOTE — Telephone Encounter (Signed)
 Pharmacy Patient Advocate Encounter  Received notification from [employer] that Prior Authorization for Mounjaro 2.5MG /0.5ML auto-injectors  has been APPROVED from 03/16/24 to 03/16/25. Ran test claim, Copay is $25. This test claim was processed through [facility]- copay amounts may vary at other pharmacies due to pharmacy/plan contracts, or as the patient moves through the different stages of their insurance plan.   PA #/Case ID/Reference #: [id number]

## 2024-03-25 ENCOUNTER — Other Ambulatory Visit (HOSPITAL_COMMUNITY): Payer: Self-pay

## 2024-03-27 ENCOUNTER — Other Ambulatory Visit (HOSPITAL_COMMUNITY): Payer: Self-pay

## 2024-03-31 ENCOUNTER — Telehealth: Payer: Self-pay

## 2024-03-31 NOTE — Telephone Encounter (Signed)
 Copied from CRM #[id number]. Topic: Clinical - Request for Lab/Test Order >> Mar 31, 2024 10:43 AM Mercedes MATSU wrote: Reason for CRM: Heidy calling from [employer] needs a updated icd-10 code on the Cologuard order. She states its a incorrect code and needs to be changed. She can be reached at [phone]. A new order is not needed, a verbal can also be called in.    Case #: [id number]

## 2024-04-04 ENCOUNTER — Telehealth: Payer: Self-pay

## 2024-04-04 NOTE — Addendum Note (Signed)
 Addended by: HEDDY IP R on: 04/04/2024 12:02 PM   Modules accepted: Orders

## 2024-04-04 NOTE — Telephone Encounter (Signed)
 Copied from CRM #[id number]. Topic: Clinical - Request for Lab/Test Order >> Mar 31, 2024 10:43 AM Mercedes MATSU wrote: Reason for CRM: Heidy calling from [employer] needs a updated icd-10 code on the Cologuard order. She states its a incorrect code and needs to be changed. She can be reached at [phone]. A new order is not needed, a verbal can also be called in.    Case #: [id number] >> Apr 04, 2024  8:29 AM Montie POUR wrote: Eleanor with [employer] is calling back to get the ICD code for Muriel's Cologuard. They can not mail it out until they receive code. Phone number  is [phone] - Speak to anyone with Provider's Support.  Call ref. Number [id number]

## 2024-04-04 NOTE — Telephone Encounter (Signed)
 I was able to speak with Luis Johns at [employer] to provide her with the ICD-10 code needed for pts Cologuard to be sent to his home.  The test has been ordered and should arrive to pts home within 3-10 business days.
